# Patient Record
Sex: Male | Born: 1965 | Race: White | Hispanic: No | Marital: Married | Smoking: Current every day smoker
Health system: Southern US, Community
[De-identification: ages and names within clinical notes are randomized; demographics above are authoritative.]

## PROBLEM LIST (undated history)

## (undated) DIAGNOSIS — I10 Essential (primary) hypertension: Secondary | ICD-10-CM

## (undated) DIAGNOSIS — E785 Hyperlipidemia, unspecified: Secondary | ICD-10-CM

## (undated) DIAGNOSIS — M1990 Unspecified osteoarthritis, unspecified site: Secondary | ICD-10-CM

## (undated) DIAGNOSIS — F329 Major depressive disorder, single episode, unspecified: Secondary | ICD-10-CM

## (undated) DIAGNOSIS — N200 Calculus of kidney: Secondary | ICD-10-CM

## (undated) DIAGNOSIS — N189 Chronic kidney disease, unspecified: Secondary | ICD-10-CM

## (undated) DIAGNOSIS — F32A Depression, unspecified: Secondary | ICD-10-CM

## (undated) HISTORY — DX: Calculus of kidney: N20.0

---
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
## 2001-03-14 DIAGNOSIS — M1990 Unspecified osteoarthritis, unspecified site: Secondary | ICD-10-CM

## 2001-03-14 HISTORY — DX: Unspecified osteoarthritis, unspecified site: M19.90

## 2001-08-09 ENCOUNTER — Ambulatory Visit (HOSPITAL_COMMUNITY): Admission: RE | Admit: 2001-08-09 | Discharge: 2001-08-09 | Payer: Self-pay | Admitting: Urology

## 2001-08-09 ENCOUNTER — Encounter: Payer: Self-pay | Admitting: Urology

## 2002-05-28 ENCOUNTER — Encounter: Payer: Self-pay | Admitting: Family Medicine

## 2002-05-28 ENCOUNTER — Ambulatory Visit (HOSPITAL_COMMUNITY): Admission: RE | Admit: 2002-05-28 | Discharge: 2002-05-28 | Payer: Self-pay | Admitting: Family Medicine

## 2002-08-17 ENCOUNTER — Encounter: Payer: Self-pay | Admitting: *Deleted

## 2002-08-17 ENCOUNTER — Emergency Department (HOSPITAL_COMMUNITY): Admission: EM | Admit: 2002-08-17 | Discharge: 2002-08-17 | Payer: Self-pay

## 2011-03-15 DIAGNOSIS — I10 Essential (primary) hypertension: Secondary | ICD-10-CM

## 2011-03-15 HISTORY — DX: Essential (primary) hypertension: I10

## 2011-06-07 ENCOUNTER — Ambulatory Visit (HOSPITAL_COMMUNITY)
Admission: RE | Admit: 2011-06-07 | Discharge: 2011-06-07 | Disposition: A | Discharge status: Home / Self Care | Payer: Self-pay | Source: Ambulatory Visit | Attending: Urology | Admitting: Urology

## 2011-06-07 ENCOUNTER — Other Ambulatory Visit (HOSPITAL_COMMUNITY): Payer: Self-pay | Admitting: Urology

## 2011-06-07 DIAGNOSIS — N200 Calculus of kidney: Secondary | ICD-10-CM

## 2011-06-09 ENCOUNTER — Encounter (HOSPITAL_COMMUNITY): Payer: Self-pay

## 2011-06-13 NOTE — Patient Instructions (Addendum)
19 Patrick Rollins  06/13/2011   Your procedure is scheduled on:  06/15/2011  Report to [facility] at 1310 PM  Call this number if you have problems the morning of surgery: [phone]   Remember:   Do not eat food:After Midnight.  May have clear liquids:until Midnight .  Clear liquids include soda, tea, black coffee, apple or grape juice, broth.  Take these medicines the morning of surgery with A SIP OF WATER: lorcet,lisionopril   Do not wear jewelry, make-up or nail polish.  Do not wear lotions, powders, or perfumes. You may wear deodorant.  Do not shave 48 hours prior to surgery.  Do not bring valuables to the hospital.  Contacts, dentures or bridgework may not be worn into surgery.  Leave suitcase in the car. After surgery it may be brought to your room.  For patients admitted to the hospital, checkout time is 11:00 AM the day of discharge.   Patients discharged the day of surgery will not be allowed to drive home.  Name and phone number of your driver: family  Special Instructions: N/A   Please read over the following fact sheets that you were given: Pain Booklet, MRSA Information, Surgical Site Infection Prevention, Anesthesia Post-op Instructions and Care and Recovery After Surgery Lithotripsy for Kidney Stones WHAT ARE KIDNEY STONES? The kidneys filter blood for chemicals the body cannot use. These waste chemicals are eliminated in the urine. They are removed from the body. Under some conditions, these chemicals may become concentrated. When this happens, they form crystals in the urine. When these crystals build up and stick together, stones may form. When these stones block the flow of urine through the urinary tract, they may cause severe pain. The urinary tract is very sensitive to blockage and stretching by the stone. WHAT IS LITHOTRIPSY? Lithotripsy is a treatment that can sometimes help eliminate kidney stones and pain faster. A form of lithotripsy, also known as ESWL  (extracorporeal shock wave lithotripsy), is a nonsurgical procedure that helps your body rid itself of the kidney stone with a minimum amount of pain. EWSL is a method of crushing a kidney stone with shock waves. These shock waves pass through your body. They cause the kidney stones to crumble while still in the urinary tract. It is then easier for the smaller pieces of stone to pass in the urine. Lithotripsy usually takes about an hour. It is done in a hospital, a lithotripsy center, or a mobile unit. It usually does not require an overnight stay. Your caregiver will instruct you on preparation for the procedure. Your caregiver will tell you what to expect afterward. LET YOUR CAREGIVER KNOW ABOUT:  Allergies.   Medicines taken including herbs, eye drops, over the counter medicines (including aspirin, aleve, or motrin for treatment of inflammatory conditions) and creams.   Use of steroids (by mouth or creams).   Previous problems with anesthetics or novocaine.   Possibility of pregnancy, if this applies.   History of blood clots (thrombophlebitis).   History of bleeding or blood problems.   Previous surgery.   Other health problems.  RISKS AND COMPLICATIONS Complications of lithotripsy are uncommon, but include the following:  Infection.   Bleeding of the kidney.   Bruising of the kidney or skin.   Obstruction of the ureter (the passageway from the kidney to the bladder).   Failure of the stone to fragment (break apart).  PROCEDURE A stent (flexible tube with holes) may be placed in your ureter. The  ureter is the tube that transports the urine from the kidneys to the bladder. Your caregiver may place a stent before the procedure. This will help keep urine flowing from the kidney if the fragments of the stone block the ureter. You may receive an intravenous (IV) line to give you fluids and medicines. These medicines may help you relax or make you sleep. During the procedure, you will  lie comfortably on a fluid-filled cushion or in a warm-water bath. After an x-ray or ultrasound locates your stone, shock waves are aimed at the stone. If you are awake, you may feel a tapping sensation (feeling) as the shock waves pass through your body. If large stone particles remain after treatment, a second procedure may be necessary at a later date. For comfort during the test:  Relax as much as possible.   Try to remain still as much as possible.   Try to follow instructions to speed up the test.   Let your caregiver know if you are uncomfortable, anxious, or in pain.  AFTER THE PROCEDURE  After surgery, you will be taken to the recovery area. A nurse will watch and check your progress. Once you're awake, stable, and taking fluids well, you will be allowed to go home as long as there are no problems. You may be prescribed antibiotics (medicines that kill germs) to help prevent infection. You may also be prescribed pain medicine if needed. In a week or two, your doctor may remove your stent, if you have one. Your caregiver will check to see whether or not stone particles remain. PASSING THE STONE It may take anywhere from a day to several weeks for the stone particles to leave your body. During this time, drink at least 8 to 12 eight ounce glasses of water every day. It is normal for your urine to be cloudy or slightly bloody for a few weeks following this procedure. You may even see small pieces of stone in your urine. A slight fever and some pain are also normal. Your caregiver may ask you to strain your urine to collect some stone particles for chemical analysis. If you find particles while straining the urine, save them. Analysis tells you and the caregiver what the stone is made of. Knowing this may help prevent future stones. PREVENTING FUTURE STONES  Drink about 8 to 12, eight-ounce glasses of water every day.   Follow the diet your caregiver recommends.   Take your prescribed  medicine.   See your caregiver regularly for checkups.  SEEK IMMEDIATE MEDICAL CARE IF:  You develop an oral temperature above 102 F (38.9 C), or as your caregiver suggests.   Your pain is not relieved by medicine.   You develop nausea (feeling sick to your stomach) and vomiting.   You develop heavy bleeding.   You have difficulty urinating.  Document Released: 02/26/2000 Document Revised: 02/17/2011 Document Reviewed: 12/21/2007 [facility] Patient Information 2012 [place], [place].PATIENT INSTRUCTIONS POST-ANESTHESIA  IMMEDIATELY FOLLOWING SURGERY:  Do not drive or operate machinery for the first twenty four hours after surgery.  Do not make any important decisions for twenty four hours after surgery or while taking narcotic pain medications or sedatives.  If you develop intractable nausea and vomiting or a severe headache please notify your doctor immediately.  FOLLOW-UP:  Please make an appointment with your surgeon as instructed. You do not need to follow up with anesthesia unless specifically instructed to do so.  WOUND CARE INSTRUCTIONS (if applicable):  Keep a dry clean  dressing on the anesthesia/puncture wound site if there is drainage.  Once the wound has quit draining you may leave it open to air.  Generally you should leave the bandage intact for twenty four hours unless there is drainage.  If the epidural site drains for more than 36-48 hours please call the anesthesia department.  QUESTIONS?:  Please feel free to call your physician or the hospital operator if you have any questions, and they will be happy to assist you.     [facility] Anesthesia Department [address] [place] [phone]

## 2011-06-14 ENCOUNTER — Encounter (HOSPITAL_COMMUNITY): Payer: Self-pay

## 2011-06-14 ENCOUNTER — Other Ambulatory Visit: Payer: Self-pay

## 2011-06-14 ENCOUNTER — Encounter (HOSPITAL_COMMUNITY)
Admission: RE | Admit: 2011-06-14 | Discharge: 2011-06-14 | Disposition: A | Discharge status: Home / Self Care | Payer: Medicaid Other | Source: Ambulatory Visit | Attending: Urology | Admitting: Urology

## 2011-06-14 HISTORY — DX: Chronic kidney disease, unspecified: N18.9

## 2011-06-14 HISTORY — DX: Unspecified osteoarthritis, unspecified site: M19.90

## 2011-06-14 HISTORY — DX: Essential (primary) hypertension: I10

## 2011-06-14 LAB — BASIC METABOLIC PANEL
BUN: 9 mg/dL (ref 6–23)
CO2: 26 mEq/L (ref 19–32)
Calcium: 9.7 mg/dL (ref 8.4–10.5)
Chloride: 104 mEq/L (ref 96–112)
Creatinine, Ser: 0.87 mg/dL (ref 0.50–1.35)
Glucose, Bld: 82 mg/dL (ref 70–99)

## 2011-06-15 ENCOUNTER — Ambulatory Visit (HOSPITAL_COMMUNITY): Payer: Medicaid Other

## 2011-06-15 ENCOUNTER — Ambulatory Visit (HOSPITAL_COMMUNITY)
Admission: RE | Admit: 2011-06-15 | Discharge: 2011-06-15 | Disposition: A | Discharge status: Home / Self Care | Payer: Medicaid Other | Source: Ambulatory Visit | Attending: Urology | Admitting: Urology

## 2011-06-15 ENCOUNTER — Encounter (HOSPITAL_COMMUNITY)
Admission: RE | Disposition: A | Discharge status: Home / Self Care | Payer: Self-pay | Source: Ambulatory Visit | Attending: Urology

## 2011-06-15 ENCOUNTER — Encounter (HOSPITAL_COMMUNITY): Payer: Self-pay | Admitting: *Deleted

## 2011-06-15 DIAGNOSIS — Z79899 Other long term (current) drug therapy: Secondary | ICD-10-CM | POA: Insufficient documentation

## 2011-06-15 DIAGNOSIS — F17200 Nicotine dependence, unspecified, uncomplicated: Secondary | ICD-10-CM | POA: Insufficient documentation

## 2011-06-15 DIAGNOSIS — Z01812 Encounter for preprocedural laboratory examination: Secondary | ICD-10-CM | POA: Insufficient documentation

## 2011-06-15 DIAGNOSIS — Z01810 Encounter for preprocedural cardiovascular examination: Secondary | ICD-10-CM | POA: Insufficient documentation

## 2011-06-15 DIAGNOSIS — I10 Essential (primary) hypertension: Secondary | ICD-10-CM | POA: Insufficient documentation

## 2011-06-15 DIAGNOSIS — N200 Calculus of kidney: Secondary | ICD-10-CM | POA: Insufficient documentation

## 2011-06-15 HISTORY — PX: EXTRACORPOREAL SHOCK WAVE LITHOTRIPSY: SHX1557

## 2011-06-15 SURGERY — LITHOTRIPSY, ESWL
Anesthesia: Moderate Sedation | Laterality: Right

## 2011-06-15 MED ORDER — DIAZEPAM 5 MG PO TABS
ORAL_TABLET | ORAL | Status: AC
  Administered 2011-06-15: 5 mg via ORAL
  Filled 2011-06-15: qty 2

## 2011-06-15 MED ORDER — SODIUM CHLORIDE 0.45 % IV SOLN
INTRAVENOUS | Status: DC
  Administered 2011-06-15: 1000 mL via INTRAVENOUS

## 2011-06-15 MED ORDER — DIPHENHYDRAMINE HCL 25 MG PO CAPS
ORAL_CAPSULE | ORAL | Status: AC
  Administered 2011-06-15: 25 mg via ORAL
  Filled 2011-06-15: qty 1

## 2011-06-15 MED ORDER — DIAZEPAM 5 MG PO TABS
10.0000 mg | ORAL_TABLET | Freq: Once | ORAL | Status: AC
  Administered 2011-06-15: 5 mg via ORAL

## 2011-06-15 MED ORDER — DIAZEPAM 5 MG PO TABS
5.0000 mg | ORAL_TABLET | Freq: Once | ORAL | Status: AC
  Administered 2011-06-15: 5 mg via ORAL

## 2011-06-15 MED ORDER — DIPHENHYDRAMINE HCL 25 MG PO CAPS
25.0000 mg | ORAL_CAPSULE | Freq: Once | ORAL | Status: AC
  Administered 2011-06-15: 25 mg via ORAL

## 2011-06-15 SURGICAL SUPPLY — 3 items
CLOTH BEACON ORANGE TIMEOUT ST (SAFETY) IMPLANT
GOWN STRL REIN XL XLG (GOWN DISPOSABLE) IMPLANT
TOWEL OR 17X26 4PK STRL BLUE (TOWEL DISPOSABLE) IMPLANT
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
## 2011-06-15 NOTE — Progress Notes (Signed)
No change in H&P on reexamination. 

## 2011-06-15 NOTE — Discharge Instructions (Signed)
Anesthesia Post Note  Patient: Patrick Rollins  Procedure(s) Performed: Procedure(s) (LRB): EXTRACORPOREAL SHOCK WAVE LITHOTRIPSY (ESWL) (Right)  Anesthesia type: conscious sedation  Patient location: Short Stay  Post pain: Pain level controlled  Post assessment: Post-op Vital signs reviewed, Adequate PO intake, Pain level controlled and No headache  Last Vitals:  Filed Vitals:   06/15/11 1635  BP: 150/80  Pulse: 62  Temp: 97.7 F (36.5 C)  Resp: 20    Post vital signs: stable  Level of consciousness: awake, alert  and oriented  Complications: No apparent anesthesia complicationsLithotripsy Care After Refer to this sheet for the next few weeks. These discharge instructions provide you with general information on caring for yourself after you leave the hospital. Your caregiver may also give you specific instructions. Your treatment has been planned according to the most current medical practices available, but unavoidable complications sometimes occur. If you have any problems or questions after discharge, please call your caregiver. AFTER THE PROCEDURE   The recovery time will vary with the procedure done.   You will be taken to the recovery area. A nurse will watch and check your progress. Once you are awake, stable, and taking fluids well, you will be allowed to go home as long as there are no problems.   Your urine may have a red tinge for a few days after treatment. Blood loss is usually minimal.   You may have soreness in the back or flank area. This usually goes away after a few days. The procedure can cause blotches or bruises on the back where the pressure wave enters the skin. These marks usually cause only minimal discomfort and should disappear in a short time.   Stone fragments should begin to pass within 24 hours of treatment. However, a delayed passage is not unusual.   You may have pain, discomfort, and feel sick to your stomach (nauseous) when the crushed  (pulverized) fragments of stone are passed down the tube from the kidney to the bladder. Stone fragments can pass soon after the procedure and may last for up to 4 to 8 weeks.   A small number of patients may have severe pain when stone fragments are not able to pass, which leads to an obstruction.   If your stone is greater than 1 inch/2.5 centimeters in diameter or if you have multiple stones that have a combined diameter greater than 1 inch/2.5 centimeters, you may require more than 1 treatment.   You must have someone drive you home.  HOME CARE INSTRUCTIONS   Rest at home until you feel your energy improving.   Only take over-the-counter or prescription medicines for pain, discomfort, or fever as directed by your caregiver. Depending on the type of lithotripsy, you may need to take medicines (antibiotics) that kill germs and anti-inflammatory medicines for a few days.   Drink enough water and fluids to keep your urine clear or pale yellow. This helps "flush" your kidneys. It helps pass any remaining pieces of stone and prevents stones from coming back.   Most people can resume daily activities within 1 or 2 days after standard lithotripsy. It can take longer to recover from laser and percutaneous lithotripsy.   If the stones are in your urinary system, you may be asked to strain your urine at home to look for stones. Any stones that are found can be sent to a medical lab for examination.   Visit your caregiver for a follow-up appointment in a few weeks. Your  doctor may remove your stent if you have one. Your caregiver will also check to see whether stone particles still remain.  SEEK MEDICAL CARE IF:   You have an oral temperature above 102 F (38.9 C).   Your pain is not relieved by medicine.   You have a lasting nauseous feeling.   You feel there is too much blood in the urine.   You develop persistent problems with frequent and/or painful urination that does not at least  partially improve after 2 days following the procedure.   You have a congested cough.   You feel lightheaded.   You develop a rash or any other signs that might suggest an allergic problem.   You develop any reaction or side effects to your medicine(s).  SEEK IMMEDIATE MEDICAL CARE IF:   You experience severe back and/or flank pain.   You see nothing but blood when you urinate.   You cannot pass any urine at all.   You have an oral temperature above 102 F (38.9 C), not controlled by medicine.   You develop shortness of breath, difficulty breathing, or chest pain.   You develop vomiting that will not stop after 6 to 8 hours.   You have a fainting episode.  MAKE SURE YOU:   Understand these instructions.   Will watch your condition.   Will get help right away if you are not doing well or get worse.  Document Released: 03/20/2007 Document Revised: 02/17/2011 Document Reviewed: 03/20/2007 [facility] Patient Information 2012 [place], [place].

## 2011-06-17 ENCOUNTER — Encounter (HOSPITAL_COMMUNITY): Payer: Self-pay | Admitting: Urology

## 2011-06-22 ENCOUNTER — Other Ambulatory Visit (HOSPITAL_COMMUNITY): Payer: Self-pay | Admitting: Urology

## 2011-06-22 ENCOUNTER — Ambulatory Visit (HOSPITAL_COMMUNITY)
Admission: RE | Admit: 2011-06-22 | Discharge: 2011-06-22 | Disposition: A | Discharge status: Home / Self Care | Payer: Medicaid Other | Source: Ambulatory Visit | Attending: Urology | Admitting: Urology

## 2011-06-22 DIAGNOSIS — N200 Calculus of kidney: Secondary | ICD-10-CM

## 2011-07-21 ENCOUNTER — Other Ambulatory Visit (HOSPITAL_COMMUNITY): Payer: Self-pay | Admitting: Urology

## 2011-07-21 ENCOUNTER — Ambulatory Visit (HOSPITAL_COMMUNITY)
Admission: RE | Admit: 2011-07-21 | Discharge: 2011-07-21 | Disposition: A | Discharge status: Home / Self Care | Payer: Medicaid Other | Source: Ambulatory Visit | Attending: Urology | Admitting: Urology

## 2011-07-21 DIAGNOSIS — N200 Calculus of kidney: Secondary | ICD-10-CM

## 2011-07-21 DIAGNOSIS — N201 Calculus of ureter: Secondary | ICD-10-CM | POA: Insufficient documentation

## 2013-09-20 IMAGING — CR DG ABDOMEN 1V
1 series · 1 of 1 positions shown · non-contrast
Comparison: 06/15/2010.  No recent CT.  Most recent CT of
08/09/2001 reviewed.

CLINICAL DATA: History of right-sided renal calculi.  Lithotripsy
last week.  Recent stone passage.

ABDOMEN - 1 VIEW

[view not recorded]
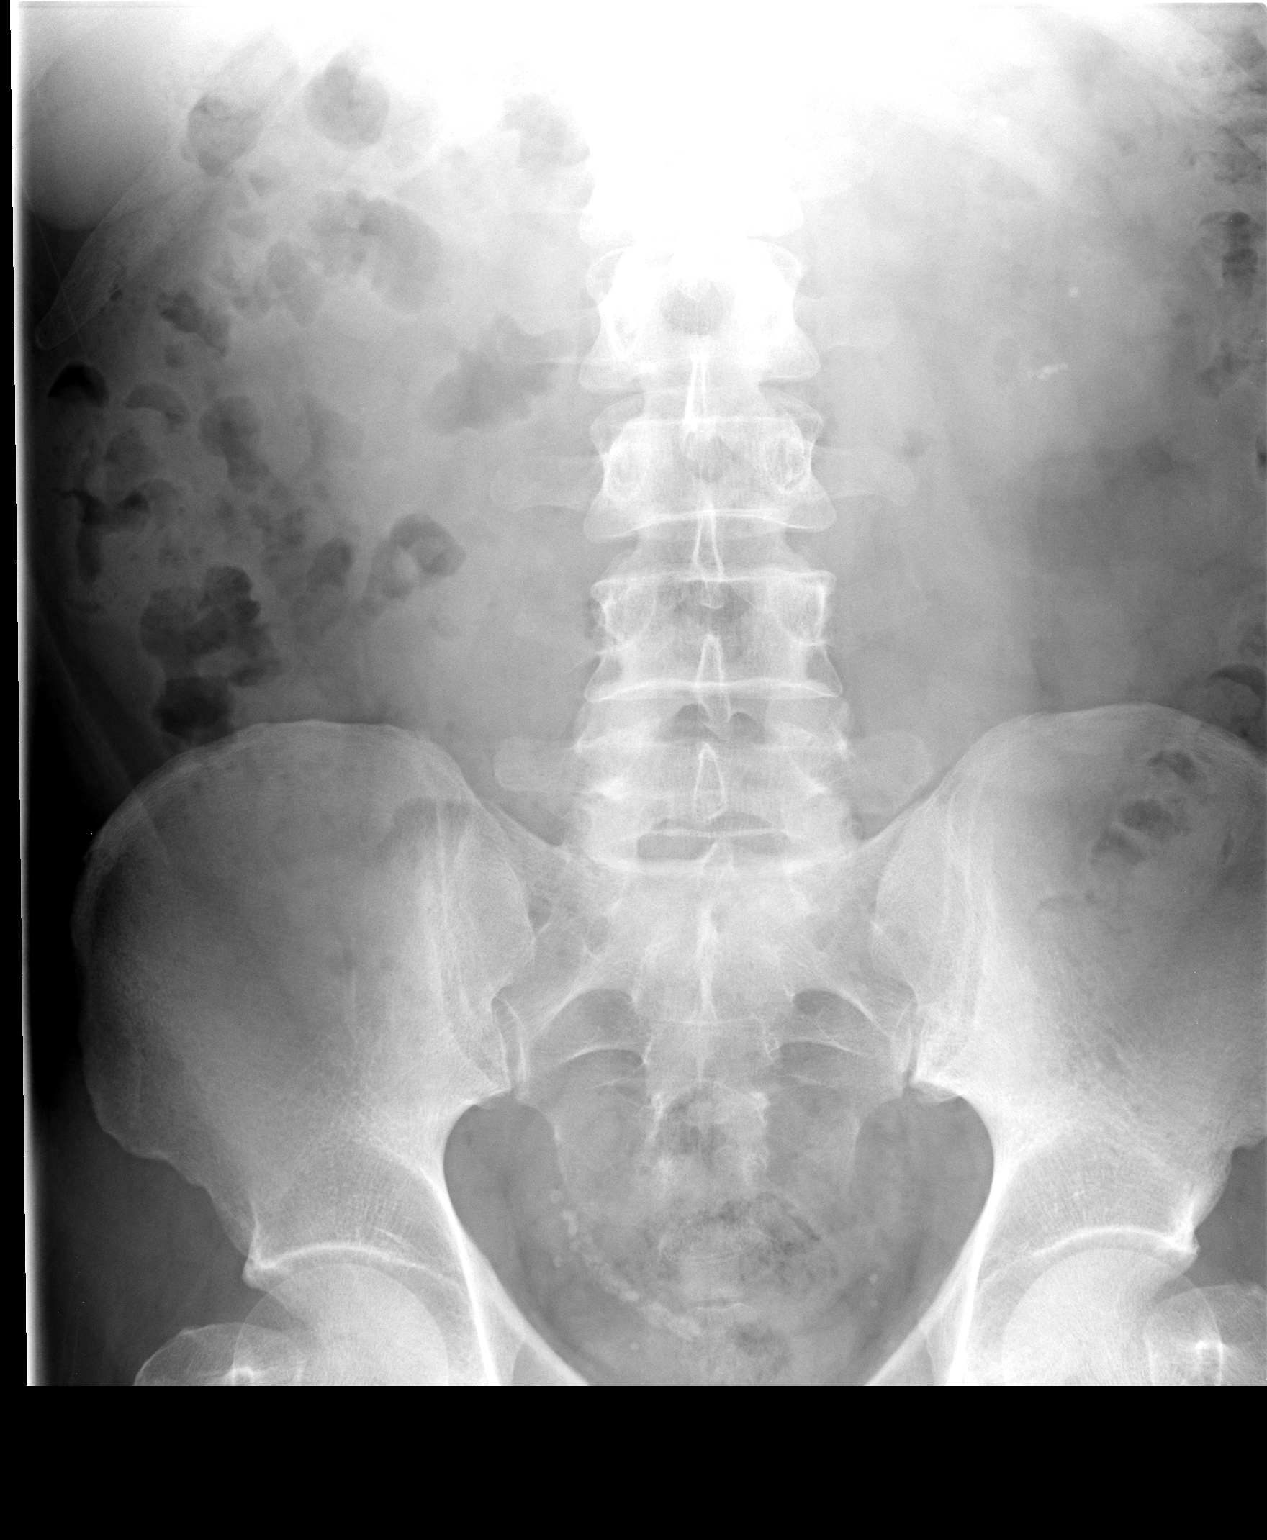

[1 of 1 positions shown; findings below may reference images not displayed]

FINDINGS: Small calculi identified about the inter lower pole left
kidney.  Largest measures 7 mm; projects over the lower pole.

The dominant stone or stones within the right kidney are no longer
identified.  There may be tiny residual collecting systems stones.

Vascular calcifications identified in the pelvis.  Foci of
increased density along the expected course of the right ureter are
new and suspicious for numerous small stones.
IMPRESSION: 1.  Interval passage of dominant right-sided renal stone or stones.
Suspect multiple small stone fragments within the distal right
ureter.
2.  Small volume left sided renal collecting system calculi.

## 2015-08-26 DIAGNOSIS — Z23 Encounter for immunization: Secondary | ICD-10-CM | POA: Diagnosis not present

## 2015-08-26 DIAGNOSIS — Z1389 Encounter for screening for other disorder: Secondary | ICD-10-CM | POA: Diagnosis not present

## 2015-08-26 DIAGNOSIS — Z6834 Body mass index (BMI) 34.0-34.9, adult: Secondary | ICD-10-CM | POA: Diagnosis not present

## 2015-08-26 DIAGNOSIS — G894 Chronic pain syndrome: Secondary | ICD-10-CM | POA: Diagnosis not present

## 2015-11-23 DIAGNOSIS — Z79891 Long term (current) use of opiate analgesic: Secondary | ICD-10-CM | POA: Diagnosis not present

## 2015-11-23 DIAGNOSIS — Z1389 Encounter for screening for other disorder: Secondary | ICD-10-CM | POA: Diagnosis not present

## 2015-11-23 DIAGNOSIS — G894 Chronic pain syndrome: Secondary | ICD-10-CM | POA: Diagnosis not present

## 2015-11-23 DIAGNOSIS — Z6834 Body mass index (BMI) 34.0-34.9, adult: Secondary | ICD-10-CM | POA: Diagnosis not present

## 2015-11-23 DIAGNOSIS — N200 Calculus of kidney: Secondary | ICD-10-CM | POA: Diagnosis not present

## 2015-11-23 DIAGNOSIS — I10 Essential (primary) hypertension: Secondary | ICD-10-CM | POA: Diagnosis not present

## 2015-12-29 DIAGNOSIS — Z1389 Encounter for screening for other disorder: Secondary | ICD-10-CM | POA: Diagnosis not present

## 2015-12-29 DIAGNOSIS — Z23 Encounter for immunization: Secondary | ICD-10-CM | POA: Diagnosis not present

## 2015-12-29 DIAGNOSIS — M545 Low back pain: Secondary | ICD-10-CM | POA: Diagnosis not present

## 2015-12-29 DIAGNOSIS — I10 Essential (primary) hypertension: Secondary | ICD-10-CM | POA: Diagnosis not present

## 2015-12-29 DIAGNOSIS — G894 Chronic pain syndrome: Secondary | ICD-10-CM | POA: Diagnosis not present

## 2015-12-29 DIAGNOSIS — Z6834 Body mass index (BMI) 34.0-34.9, adult: Secondary | ICD-10-CM | POA: Diagnosis not present

## 2016-01-25 DIAGNOSIS — G894 Chronic pain syndrome: Secondary | ICD-10-CM | POA: Diagnosis not present

## 2016-01-25 DIAGNOSIS — Z1389 Encounter for screening for other disorder: Secondary | ICD-10-CM | POA: Diagnosis not present

## 2016-01-25 DIAGNOSIS — I10 Essential (primary) hypertension: Secondary | ICD-10-CM | POA: Diagnosis not present

## 2016-01-25 DIAGNOSIS — M1991 Primary osteoarthritis, unspecified site: Secondary | ICD-10-CM | POA: Diagnosis not present

## 2016-01-25 DIAGNOSIS — Z6835 Body mass index (BMI) 35.0-35.9, adult: Secondary | ICD-10-CM | POA: Diagnosis not present

## 2016-02-23 DIAGNOSIS — Z1389 Encounter for screening for other disorder: Secondary | ICD-10-CM | POA: Diagnosis not present

## 2016-02-23 DIAGNOSIS — Z6835 Body mass index (BMI) 35.0-35.9, adult: Secondary | ICD-10-CM | POA: Diagnosis not present

## 2016-02-23 DIAGNOSIS — N200 Calculus of kidney: Secondary | ICD-10-CM | POA: Diagnosis not present

## 2016-02-23 DIAGNOSIS — I10 Essential (primary) hypertension: Secondary | ICD-10-CM | POA: Diagnosis not present

## 2016-02-23 DIAGNOSIS — Z0001 Encounter for general adult medical examination with abnormal findings: Secondary | ICD-10-CM | POA: Diagnosis not present

## 2016-02-23 DIAGNOSIS — Z23 Encounter for immunization: Secondary | ICD-10-CM | POA: Diagnosis not present

## 2016-02-23 DIAGNOSIS — G894 Chronic pain syndrome: Secondary | ICD-10-CM | POA: Diagnosis not present

## 2016-02-24 DIAGNOSIS — Z1389 Encounter for screening for other disorder: Secondary | ICD-10-CM | POA: Diagnosis not present

## 2016-02-24 DIAGNOSIS — Z23 Encounter for immunization: Secondary | ICD-10-CM | POA: Diagnosis not present

## 2016-02-24 DIAGNOSIS — Z6835 Body mass index (BMI) 35.0-35.9, adult: Secondary | ICD-10-CM | POA: Diagnosis not present

## 2016-02-24 DIAGNOSIS — E6609 Other obesity due to excess calories: Secondary | ICD-10-CM | POA: Diagnosis not present

## 2016-02-24 DIAGNOSIS — E748 Other specified disorders of carbohydrate metabolism: Secondary | ICD-10-CM | POA: Diagnosis not present

## 2016-05-23 DIAGNOSIS — Z1389 Encounter for screening for other disorder: Secondary | ICD-10-CM | POA: Diagnosis not present

## 2016-05-23 DIAGNOSIS — E669 Obesity, unspecified: Secondary | ICD-10-CM | POA: Diagnosis not present

## 2016-05-23 DIAGNOSIS — G894 Chronic pain syndrome: Secondary | ICD-10-CM | POA: Diagnosis not present

## 2016-05-23 DIAGNOSIS — E782 Mixed hyperlipidemia: Secondary | ICD-10-CM | POA: Diagnosis not present

## 2016-05-23 DIAGNOSIS — Z681 Body mass index (BMI) 19 or less, adult: Secondary | ICD-10-CM | POA: Diagnosis not present

## 2016-08-18 DIAGNOSIS — I10 Essential (primary) hypertension: Secondary | ICD-10-CM | POA: Diagnosis not present

## 2016-08-18 DIAGNOSIS — L918 Other hypertrophic disorders of the skin: Secondary | ICD-10-CM | POA: Diagnosis not present

## 2016-08-18 DIAGNOSIS — Z1389 Encounter for screening for other disorder: Secondary | ICD-10-CM | POA: Diagnosis not present

## 2016-08-18 DIAGNOSIS — G894 Chronic pain syndrome: Secondary | ICD-10-CM | POA: Diagnosis not present

## 2016-08-18 DIAGNOSIS — Z6834 Body mass index (BMI) 34.0-34.9, adult: Secondary | ICD-10-CM | POA: Diagnosis not present

## 2016-11-01 ENCOUNTER — Ambulatory Visit (INDEPENDENT_AMBULATORY_CARE_PROVIDER_SITE_OTHER): Payer: BLUE CROSS/BLUE SHIELD | Admitting: Gastroenterology

## 2016-11-01 ENCOUNTER — Other Ambulatory Visit: Payer: Self-pay

## 2016-11-01 ENCOUNTER — Telehealth: Payer: Self-pay

## 2016-11-01 ENCOUNTER — Encounter: Payer: Self-pay | Admitting: Gastroenterology

## 2016-11-01 DIAGNOSIS — Z1211 Encounter for screening for malignant neoplasm of colon: Secondary | ICD-10-CM | POA: Diagnosis not present

## 2016-11-01 DIAGNOSIS — Z1212 Encounter for screening for malignant neoplasm of rectum: Principal | ICD-10-CM

## 2016-11-01 DIAGNOSIS — Z79899 Other long term (current) drug therapy: Secondary | ICD-10-CM | POA: Diagnosis not present

## 2016-11-01 MED ORDER — PEG 3350-KCL-NA BICARB-NACL 420 G PO SOLR: 4000.0000 mL | ORAL | 0 refills | Status: DC

## 2016-11-01 NOTE — Patient Instructions (Signed)
1. Colonoscopy as scheduled. See separate instructions.  

## 2016-11-01 NOTE — Assessment & Plan Note (Signed)
51 year old gentleman presenting to schedule his first ever colonoscopy. Chronically on narcotics. Plan for deep sedation in the OR.  I have discussed the risks, alternatives, benefits with regards to but not limited to the risk of reaction to medication, bleeding, infection, perforation and the patient is agreeable to proceed. Written consent to be obtained.

## 2016-11-01 NOTE — Progress Notes (Addendum)
REVIEWED-NO ADDITIONAL RECOMMENDATIONS.Marland Kitchen  Primary Care Physician:  Redmond School, MD  Primary Gastroenterologist:  Barney Drain, MD   Chief Complaint  Patient presents with  . Colonoscopy    HPI:  Patrick Rollins is a 51 y.o. male here at the request of Dr. Gerarda Fraction for screening colonoscopy. He has no FH of CRC. BM 4-5 times per day depending on what he eats. No Melena or rectal bleeding. Denies abdominal pain. He believes he has a hemorrhoid aggravated by heavy lifting. No frequent heartburn, dysphagia, vomiting, unintentional weight loss.  Chronically on oxycodone, denies constipation.  Current Outpatient Prescriptions  Medication Sig Dispense Refill  . aspirin EC 81 MG tablet Take 81 mg by mouth daily.    Marland Kitchen atorvastatin (LIPITOR) 20 MG tablet Take 20 mg by mouth daily.    Marland Kitchen gemfibrozil (LOPID) 600 MG tablet Take 600 mg by mouth 2 (two) times daily before a meal.    . lisinopril (PRINIVIL,ZESTRIL) 10 MG tablet Take 10 mg by mouth daily.    . naproxen (NAPROSYN) 500 MG tablet Take 500 mg by mouth 2 (two) times daily with a meal.    . oxyCODONE-acetaminophen (PERCOCET) 7.5-325 MG per tablet Take 1 tablet by mouth every 6 (six) hours as needed.     No current facility-administered medications for this visit.     Allergies as of 11/01/2016 - Review Complete 11/01/2016  Allergen Reaction Noted  . Penicillins Hives 06/09/2011    Past Medical History:  Diagnosis Date  . 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 Chronic kidney disease    Kidney stones  . Hypertension 2013  . Kidney stones     Past Surgical History:  Procedure Laterality Date  . car [place]  . EXTRACORPOREAL SHOCK WAVE LITHOTRIPSY  06/15/2011   Procedure: EXTRACORPOREAL SHOCK WAVE LITHOTRIPSY (ESWL);  Surgeon: Marissa Nestle, MD;  Location: [facility];  Service: Urology;  Laterality: Right;  ESWL Right Renal Calculus    Family History  Problem Relation Age of Onset  . Cancer Father        ?lung  . Muscular dystrophy  Son        died age 28  . Breast cancer Paternal Aunt   . Breast cancer Other   . Colon cancer Neg Hx     Social History   Social History  . Marital status: Married    Spouse name: N/A  . Number of children: N/A  . Years of education: N/A   Occupational History  . Not on file.   Social History Main Topics  . Smoking status: Current Every Day Smoker    Packs/day: 1.50    Years: 30.00    Types: Cigarettes  . Smokeless tobacco: Never Used  . Alcohol use Yes     Comment: few drinks on saturdays  . Drug use: No  . Sexual activity: Not on file   Other Topics Concern  . Not on file   Social History Narrative  . No narrative on file      ROS:  General: Negative for anorexia, weight loss, fever, chills, fatigue, weakness. Eyes: Negative for vision changes.  ENT: Negative for hoarseness, difficulty swallowing , nasal congestion. CV: Negative for chest pain, angina, palpitations, dyspnea on exertion, peripheral edema.  Respiratory: Negative for dyspnea at rest, dyspnea on exertion, cough, sputum, wheezing.  GI: See history of present illness. GU:  Negative for dysuria, hematuria, urinary incontinence, urinary frequency, nocturnal urination.  MS: Negative for joint pain, low  back pain.  Derm: Negative for rash or itching.  Neuro: Negative for weakness, abnormal sensation, seizure, frequent headaches, memory loss, confusion.  Psych: Negative for anxiety, depression, suicidal ideation, hallucinations.  Endo: Negative for unusual weight change.  Heme: Negative for bruising or bleeding. Allergy: Negative for rash or hives.    Physical Examination:  BP (!) 148/88   Pulse 93   Temp (!) 97 F (36.1 C) (Oral)   Ht 5\' 6"  (1.676 m)   Wt 214 lb 12.8 oz (97.4 kg)   BMI 34.67 kg/m    General: Well-nourished, well-developed in no acute distress.  Head: Normocephalic, atraumatic.   Eyes: Conjunctiva pink, no icterus. Mouth: Oropharyngeal mucosa moist and pink , no lesions  erythema or exudate. Neck: Supple without thyromegaly, masses, or lymphadenopathy.  Lungs: Clear to auscultation bilaterally.  Heart: Regular rate and rhythm, no murmurs rubs or gallops.  Abdomen: Bowel sounds are normal, nontender, nondistended, no hepatosplenomegaly or masses, no abdominal bruits, no rebound or guarding.  Small umb hernia easily reducible and nontender Rectal: Deferred Extremities: No lower extremity edema. No clubbing or deformities.  Neuro: Alert and oriented x 4 , grossly normal neurologically.  Skin: Warm and dry, no rash or jaundice.   Psych: Alert and cooperative, normal mood and affect.

## 2016-11-01 NOTE — Progress Notes (Signed)
CC'D TO PCP °

## 2016-11-01 NOTE — Telephone Encounter (Signed)
Called and informed pt of pre-op appt 11/16/16 at 9:00am. Letter also mailed.

## 2016-11-09 NOTE — Patient Instructions (Signed)
BARNES FLOREK  11/09/2016     @PREFPERIOPPHARMACY @   Your procedure is scheduled on  11/22/2016   Report to [facility] at  845  A.M.  Call this number if you have problems the morning of surgery:  [phone]   Remember:  Do not eat food or drink liquids after midnight.  Take these medicines the morning of surgery with A SIP OF WATER  Lisinopril, oxycodone.   Do not wear jewelry, make-up or nail polish.  Do not wear lotions, powders, or perfumes, or deoderant.  Do not shave 48 hours prior to surgery.  Men may shave face and neck.  Do not bring valuables to the hospital.  [facility] is not responsible for any belongings or valuables.  Contacts, dentures or bridgework may not be worn into surgery.  Leave your suitcase in the car.  After surgery it may be brought to your room.  For patients admitted to the hospital, discharge time will be determined by your treatment team.  Patients discharged the day of surgery will not be allowed to drive home.   Name and phone number of your driver:   family Special instructions:  Follow the diet and prep instructions given to you by Dr Nona Dell office.  Please read over the following fact sheets that you were given. Anesthesia Post-op Instructions and Care and Recovery After Surgery       Colonoscopy, Adult A colonoscopy is an exam to look at the entire large intestine. During the exam, a lubricated, bendable tube is inserted into the anus and then passed into the rectum, colon, and other parts of the large intestine. A colonoscopy is often done as a part of normal colorectal screening or in response to certain symptoms, such as anemia, persistent diarrhea, abdominal pain, and blood in the stool. The exam can help screen for and diagnose medical problems, including:  Tumors.  Polyps.  Inflammation.  Areas of bleeding.  Tell a health care provider about:  Any allergies you have.  All medicines you are taking,  including vitamins, herbs, eye drops, creams, and over-the-counter medicines.  Any problems you or family members have had with anesthetic medicines.  Any blood disorders you have.  Any surgeries you have had.  Any medical conditions you have.  Any problems you have had passing stool. What are the risks? Generally, this is a safe procedure. However, problems may occur, including:  Bleeding.  A tear in the intestine.  A reaction to medicines given during the exam.  Infection (rare).  What happens before the procedure? Eating and drinking restrictions Follow instructions from your health care provider about eating and drinking, which may include:  A few days before the procedure - follow a low-fiber diet. Avoid nuts, seeds, dried fruit, raw fruits, and vegetables.  1-3 days before the procedure - follow a clear liquid diet. Drink only clear liquids, such as clear broth or bouillon, black coffee or tea, clear juice, clear soft drinks or sports drinks, gelatin dessert, and popsicles. Avoid any liquids that contain red or purple dye.  On the day of the procedure - do not eat or drink anything during the 2 hours before the procedure, or within the time period that your health care provider recommends.  Bowel prep If you were prescribed an oral bowel prep to clean out your colon:  Take it as told by your health care provider. Starting the day before your procedure, you  will need to drink a large amount of medicated liquid. The liquid will cause you to have multiple loose stools until your stool is almost clear or light green.  If your skin or anus gets irritated from diarrhea, you may use these to relieve the irritation: ? Medicated wipes, such as adult wet wipes with aloe and vitamin E. ? A skin soothing-product like petroleum jelly.  If you vomit while drinking the bowel prep, take a break for up to 60 minutes and then begin the bowel prep again. If vomiting continues and you  cannot take the bowel prep without vomiting, call your health care provider.  General instructions  Ask your health care provider about changing or stopping your regular medicines. This is especially important if you are taking diabetes medicines or blood thinners.  Plan to have someone take you home from the hospital or clinic. What happens during the procedure?  An IV tube may be inserted into one of your veins.  You will be given medicine to help you relax (sedative).  To reduce your risk of infection: ? Your health care team will wash or sanitize their hands. ? Your anal area will be washed with soap.  You will be asked to lie on your side with your knees bent.  Your health care provider will lubricate a long, thin, flexible tube. The tube will have a camera and a light on the end.  The tube will be inserted into your anus.  The tube will be gently eased through your rectum and colon.  Air will be delivered into your colon to keep it open. You may feel some pressure or cramping.  The camera will be used to take images during the procedure.  A small tissue sample may be removed from your body to be examined under a microscope (biopsy). If any potential problems are found, the tissue will be sent to a lab for testing.  If small polyps are found, your health care provider may remove them and have them checked for cancer cells.  The tube that was inserted into your anus will be slowly removed. The procedure may vary among health care providers and hospitals. What happens after the procedure?  Your blood pressure, heart rate, breathing rate, and blood oxygen level will be monitored until the medicines you were given have worn off.  Do not drive for 24 hours after the exam.  You may have a small amount of blood in your stool.  You may pass gas and have mild abdominal cramping or bloating due to the air that was used to inflate your colon during the exam.  It is up to you to  get the results of your procedure. Ask your health care provider, or the department performing the procedure, when your results will be ready. This information is not intended to replace advice given to you by your health care provider. Make sure you discuss any questions you have with your health care provider. Document Released: 02/26/2000 Document Revised: 12/30/2015 Document Reviewed: 05/12/2015 Elsevier Interactive Patient Education  2018 [employer].  Colonoscopy, Adult, Care After This sheet gives you information about how to care for yourself after your procedure. Your health care provider may also give you more specific instructions. If you have problems or questions, contact your health care provider. What can I expect after the procedure? After the procedure, it is common to have:  A small amount of blood in your stool for 24 hours after the procedure.  Some  gas.  Mild abdominal cramping or bloating.  Follow these instructions at home: General instructions   For the first 24 hours after the procedure: ? Do not drive or use machinery. ? Do not sign important documents. ? Do not drink alcohol. ? Do your regular daily activities at a slower pace than normal. ? Eat soft, easy-to-digest foods. ? Rest often.  Take over-the-counter or prescription medicines only as told by your health care provider.  It is up to you to get the results of your procedure. Ask your health care provider, or the department performing the procedure, when your results will be ready. Relieving cramping and bloating  Try walking around when you have cramps or feel bloated.  Apply heat to your abdomen as told by your health care provider. Use a heat source that your health care provider recommends, such as a moist heat pack or a heating pad. ? Place a towel between your skin and the heat source. ? Leave the heat on for 20-30 minutes. ? Remove the heat if your skin turns bright red. This is  especially important if you are unable to feel pain, heat, or cold. You may have a greater risk of getting burned. Eating and drinking  Drink enough fluid to keep your urine clear or pale yellow.  Resume your normal diet as instructed by your health care provider. Avoid heavy or fried foods that are hard to digest.  Avoid drinking alcohol for as long as instructed by your health care provider. Contact a health care provider if:  You have blood in your stool 2-3 days after the procedure. Get help right away if:  You have more than a small spotting of blood in your stool.  You pass large blood clots in your stool.  Your abdomen is swollen.  You have nausea or vomiting.  You have a fever.  You have increasing abdominal pain that is not relieved with medicine. This information is not intended to replace advice given to you by your health care provider. Make sure you discuss any questions you have with your health care provider. Document Released: 10/13/2003 Document Revised: 11/23/2015 Document Reviewed: 05/12/2015 Elsevier Interactive Patient Education  2018 [place] Anesthesia is a term that refers to techniques, procedures, and medicines that help a person stay safe and comfortable during a medical procedure. Monitored anesthesia care, or sedation, is one type of anesthesia. Your anesthesia specialist may recommend sedation if you will be having a procedure that does not require you to be unconscious, such as:  Cataract surgery.  A dental procedure.  A biopsy.  A colonoscopy.  During the procedure, you may receive a medicine to help you relax (sedative). There are three levels of sedation:  Mild sedation. At this level, you may feel awake and relaxed. You will be able to follow directions.  Moderate sedation. At this level, you will be sleepy. You may not remember the procedure.  Deep sedation. At this level, you will be asleep. You will  not remember the procedure.  The more medicine you are given, the deeper your level of sedation will be. Depending on how you respond to the procedure, the anesthesia specialist may change your level of sedation or the type of anesthesia to fit your needs. An anesthesia specialist will monitor you closely during the procedure. Let your health care provider know about:  Any allergies you have.  All medicines you are taking, including vitamins, herbs, eye drops, creams, and over-the-counter  medicines.  Any use of steroids (by mouth or as a cream).  Any problems you or family members have had with sedatives and anesthetic medicines.  Any blood disorders you have.  Any surgeries you have had.  Any medical conditions you have, such as sleep apnea.  Whether you are pregnant or may be pregnant.  Any use of cigarettes, alcohol, or street drugs. What are the risks? Generally, this is a safe procedure. However, problems may occur, including:  Getting too much medicine (oversedation).  Nausea.  Allergic reaction to medicines.  Trouble breathing. If this happens, a breathing tube may be used to help with breathing. It will be removed when you are awake and breathing on your own.  Heart trouble.  Lung trouble.  Before the procedure Staying hydrated Follow instructions from your health care provider about hydration, which may include:  Up to 2 hours before the procedure - you may continue to drink clear liquids, such as water, clear fruit juice, black coffee, and plain tea.  Eating and drinking restrictions Follow instructions from your health care provider about eating and drinking, which may include:  8 hours before the procedure - stop eating heavy meals or foods such as meat, fried foods, or fatty foods.  6 hours before the procedure - stop eating light meals or foods, such as toast or cereal.  6 hours before the procedure - stop drinking milk or drinks that contain milk.  2  hours before the procedure - stop drinking clear liquids.  Medicines Ask your health care provider about:  Changing or stopping your regular medicines. This is especially important if you are taking diabetes medicines or blood thinners.  Taking medicines such as aspirin and ibuprofen. These medicines can thin your blood. Do not take these medicines before your procedure if your health care provider instructs you not to.  Tests and exams  You will have a physical exam.  You may have blood tests done to show: ? How well your kidneys and liver are working. ? How well your blood can clot.  General instructions  Plan to have someone take you home from the hospital or clinic.  If you will be going home right after the procedure, plan to have someone with you for 24 hours.  What happens during the procedure?  Your blood pressure, heart rate, breathing, level of pain and overall condition will be monitored.  An IV tube will be inserted into one of your veins.  Your anesthesia specialist will give you medicines as needed to keep you comfortable during the procedure. This may mean changing the level of sedation.  The procedure will be performed. After the procedure  Your blood pressure, heart rate, breathing rate, and blood oxygen level will be monitored until the medicines you were given have worn off.  Do not drive for 24 hours if you received a sedative.  You may: ? Feel sleepy, clumsy, or nauseous. ? Feel forgetful about what happened after the procedure. ? Have a sore throat if you had a breathing tube during the procedure. ? Vomit. This information is not intended to replace advice given to you by your health care provider. Make sure you discuss any questions you have with your health care provider. Document Released: 11/24/2004 Document Revised: 08/07/2015 Document Reviewed: 06/21/2015 Elsevier Interactive Patient Education  2018 [place],  Care After These instructions provide you with information about caring for yourself after your procedure. Your health care provider may also give  you more specific instructions. Your treatment has been planned according to current medical practices, but problems sometimes occur. Call your health care provider if you have any problems or questions after your procedure. What can I expect after the procedure? After your procedure, it is common to:  Feel sleepy for several hours.  Feel clumsy and have poor balance for several hours.  Feel forgetful about what happened after the procedure.  Have poor judgment for several hours.  Feel nauseous or vomit.  Have a sore throat if you had a breathing tube during the procedure.  Follow these instructions at home: For at least 24 hours after the procedure:   Do not: ? Participate in activities in which you could fall or become injured. ? Drive. ? Use heavy machinery. ? Drink alcohol. ? Take sleeping pills or medicines that cause drowsiness. ? Make important decisions or sign legal documents. ? Take care of children on your own.  Rest. Eating and drinking  Follow the diet that is recommended by your health care provider.  If you vomit, drink water, juice, or soup when you can drink without vomiting.  Make sure you have little or no nausea before eating solid foods. General instructions  Have a responsible adult stay with you until you are awake and alert.  Take over-the-counter and prescription medicines only as told by your health care provider.  If you smoke, do not smoke without supervision.  Keep all follow-up visits as told by your health care provider. This is important. Contact a health care provider if:  You keep feeling nauseous or you keep vomiting.  You feel light-headed.  You develop a rash.  You have a fever. Get help right away if:  You have trouble breathing. This information is not intended to replace  advice given to you by your health care provider. Make sure you discuss any questions you have with your health care provider. Document Released: 06/21/2015 Document Revised: 10/21/2015 Document Reviewed: 06/21/2015 Elsevier Interactive Patient Education  [employer].

## 2016-11-10 DIAGNOSIS — G894 Chronic pain syndrome: Secondary | ICD-10-CM | POA: Diagnosis not present

## 2016-11-10 DIAGNOSIS — Z6834 Body mass index (BMI) 34.0-34.9, adult: Secondary | ICD-10-CM | POA: Diagnosis not present

## 2016-11-10 DIAGNOSIS — M1991 Primary osteoarthritis, unspecified site: Secondary | ICD-10-CM | POA: Diagnosis not present

## 2016-11-10 DIAGNOSIS — E669 Obesity, unspecified: Secondary | ICD-10-CM | POA: Diagnosis not present

## 2016-11-10 DIAGNOSIS — Z1389 Encounter for screening for other disorder: Secondary | ICD-10-CM | POA: Diagnosis not present

## 2016-11-16 ENCOUNTER — Encounter (HOSPITAL_COMMUNITY)
Admission: RE | Admit: 2016-11-16 | Discharge: 2016-11-16 | Disposition: A | Discharge status: Home / Self Care | Payer: BLUE CROSS/BLUE SHIELD | Source: Ambulatory Visit | Attending: Gastroenterology | Admitting: Gastroenterology

## 2016-11-16 ENCOUNTER — Encounter (HOSPITAL_COMMUNITY): Payer: Self-pay

## 2016-11-16 DIAGNOSIS — Z01810 Encounter for preprocedural cardiovascular examination: Secondary | ICD-10-CM | POA: Diagnosis not present

## 2016-11-16 DIAGNOSIS — I517 Cardiomegaly: Secondary | ICD-10-CM | POA: Insufficient documentation

## 2016-11-16 DIAGNOSIS — Z1211 Encounter for screening for malignant neoplasm of colon: Secondary | ICD-10-CM | POA: Insufficient documentation

## 2016-11-16 DIAGNOSIS — I4519 Other right bundle-branch block: Secondary | ICD-10-CM | POA: Diagnosis not present

## 2016-11-16 DIAGNOSIS — Z01818 Encounter for other preprocedural examination: Secondary | ICD-10-CM | POA: Diagnosis not present

## 2016-11-16 HISTORY — DX: Depression, unspecified: F32.A

## 2016-11-16 HISTORY — DX: Major depressive disorder, single episode, unspecified: F32.9

## 2016-11-16 HISTORY — DX: Hyperlipidemia, unspecified: E78.5

## 2016-11-16 LAB — CBC WITH DIFFERENTIAL/PLATELET
Basophils Absolute: 0 10*3/uL (ref 0.0–0.1)
Basophils Relative: 1 %
EOS PCT: 3 %
Eosinophils Absolute: 0.2 10*3/uL (ref 0.0–0.7)
HCT: 45 % (ref 39.0–52.0)
Hemoglobin: 15.1 g/dL (ref 13.0–17.0)
LYMPHS ABS: 3.1 10*3/uL (ref 0.7–4.0)
LYMPHS PCT: 36 %
MCH: 31.1 pg (ref 26.0–34.0)
MCHC: 33.6 g/dL (ref 30.0–36.0)
MCV: 92.6 fL (ref 78.0–100.0)
MONO ABS: 0.7 10*3/uL (ref 0.1–1.0)
MONOS PCT: 8 %
Neutro Abs: 4.6 10*3/uL (ref 1.7–7.7)
Neutrophils Relative %: 54 %
PLATELETS: 165 10*3/uL (ref 150–400)
RBC: 4.86 MIL/uL (ref 4.22–5.81)
RDW: 13.3 % (ref 11.5–15.5)
WBC: 8.6 10*3/uL (ref 4.0–10.5)

## 2016-11-16 LAB — BASIC METABOLIC PANEL
Anion gap: 9 (ref 5–15)
BUN: 15 mg/dL (ref 6–20)
CALCIUM: 9.1 mg/dL (ref 8.9–10.3)
CO2: 20 mmol/L — ABNORMAL LOW (ref 22–32)
Chloride: 109 mmol/L (ref 101–111)
Creatinine, Ser: 0.97 mg/dL (ref 0.61–1.24)
GFR calc Af Amer: >60 mL/min (ref >60)
GLUCOSE: 139 mg/dL — AB (ref 65–99)
Potassium: 3.9 mmol/L (ref 3.5–5.1)
Sodium: 138 mmol/L (ref 135–145)

## 2016-11-17 ENCOUNTER — Telehealth: Payer: Self-pay

## 2016-11-17 NOTE — Telephone Encounter (Signed)
Called and spoke to pt's wife. Asked if he could arrive at 8:00am 11/22/16 for tcs d/t we had a cancellation. Pt's wife said no and wanted to keep his procedure time like it is. Called and informed Endo Scheduler.

## 2016-11-22 ENCOUNTER — Ambulatory Visit (HOSPITAL_COMMUNITY)
Admission: RE | Admit: 2016-11-22 | Discharge: 2016-11-22 | Disposition: A | Discharge status: Home / Self Care | Payer: BLUE CROSS/BLUE SHIELD | Source: Ambulatory Visit | Attending: Gastroenterology | Admitting: Gastroenterology

## 2016-11-22 ENCOUNTER — Encounter (HOSPITAL_COMMUNITY)
Admission: RE | Disposition: A | Discharge status: Home / Self Care | Payer: Self-pay | Source: Ambulatory Visit | Attending: Gastroenterology

## 2016-11-22 ENCOUNTER — Encounter (HOSPITAL_COMMUNITY): Payer: Self-pay | Admitting: *Deleted

## 2016-11-22 ENCOUNTER — Ambulatory Visit (HOSPITAL_COMMUNITY): Payer: BLUE CROSS/BLUE SHIELD | Admitting: Anesthesiology

## 2016-11-22 DIAGNOSIS — M1990 Unspecified osteoarthritis, unspecified site: Secondary | ICD-10-CM | POA: Insufficient documentation

## 2016-11-22 DIAGNOSIS — D123 Benign neoplasm of transverse colon: Secondary | ICD-10-CM | POA: Insufficient documentation

## 2016-11-22 DIAGNOSIS — F329 Major depressive disorder, single episode, unspecified: Secondary | ICD-10-CM | POA: Insufficient documentation

## 2016-11-22 DIAGNOSIS — I129 Hypertensive chronic kidney disease with stage 1 through stage 4 chronic kidney disease, or unspecified chronic kidney disease: Secondary | ICD-10-CM | POA: Insufficient documentation

## 2016-11-22 DIAGNOSIS — E785 Hyperlipidemia, unspecified: Secondary | ICD-10-CM | POA: Diagnosis not present

## 2016-11-22 DIAGNOSIS — N189 Chronic kidney disease, unspecified: Secondary | ICD-10-CM | POA: Insufficient documentation

## 2016-11-22 DIAGNOSIS — Z79899 Other long term (current) drug therapy: Secondary | ICD-10-CM | POA: Insufficient documentation

## 2016-11-22 DIAGNOSIS — Z1211 Encounter for screening for malignant neoplasm of colon: Secondary | ICD-10-CM | POA: Diagnosis not present

## 2016-11-22 DIAGNOSIS — Z7982 Long term (current) use of aspirin: Secondary | ICD-10-CM | POA: Insufficient documentation

## 2016-11-22 DIAGNOSIS — K5730 Diverticulosis of large intestine without perforation or abscess without bleeding: Secondary | ICD-10-CM | POA: Insufficient documentation

## 2016-11-22 DIAGNOSIS — Z1212 Encounter for screening for malignant neoplasm of rectum: Secondary | ICD-10-CM

## 2016-11-22 DIAGNOSIS — K644 Residual hemorrhoidal skin tags: Secondary | ICD-10-CM | POA: Insufficient documentation

## 2016-11-22 DIAGNOSIS — K635 Polyp of colon: Secondary | ICD-10-CM | POA: Diagnosis not present

## 2016-11-22 HISTORY — PX: COLONOSCOPY WITH PROPOFOL: SHX5780

## 2016-11-22 SURGERY — COLONOSCOPY WITH PROPOFOL
Anesthesia: Monitor Anesthesia Care

## 2016-11-22 MED ORDER — PROPOFOL 500 MG/50ML IV EMUL
INTRAVENOUS | Status: DC | PRN
  Administered 2016-11-22: 125 ug/kg/min via INTRAVENOUS

## 2016-11-22 MED ORDER — CHLORHEXIDINE GLUCONATE CLOTH 2 % EX PADS: 6.0000 | MEDICATED_PAD | Freq: Once | CUTANEOUS | Status: DC

## 2016-11-22 MED ORDER — FENTANYL CITRATE (PF) 100 MCG/2ML IJ SOLN
25.0000 ug | Freq: Once | INTRAMUSCULAR | Status: AC
  Administered 2016-11-22: 25 ug via INTRAVENOUS

## 2016-11-22 MED ORDER — MIDAZOLAM HCL 2 MG/2ML IJ SOLN
1.0000 mg | INTRAMUSCULAR | Status: AC
Start: 2016-11-22 — End: 2016-11-22
  Administered 2016-11-22 (×2): 2 mg via INTRAVENOUS
  Filled 2016-11-22: qty 2

## 2016-11-22 MED ORDER — PROPOFOL 10 MG/ML IV BOLUS
INTRAVENOUS | Status: AC
  Filled 2016-11-22: qty 20

## 2016-11-22 MED ORDER — FENTANYL CITRATE (PF) 100 MCG/2ML IJ SOLN
INTRAMUSCULAR | Status: AC
  Filled 2016-11-22: qty 2

## 2016-11-22 MED ORDER — MIDAZOLAM HCL 2 MG/2ML IJ SOLN
INTRAMUSCULAR | Status: AC
  Filled 2016-11-22: qty 2

## 2016-11-22 MED ORDER — LACTATED RINGERS IV SOLN
INTRAVENOUS | Status: DC
  Administered 2016-11-22: 10:00:00 via INTRAVENOUS

## 2016-11-22 NOTE — Discharge Instructions (Signed)
You have MODERATE EXTERNAL hemorrhoids. YOU HAVE diverticulosis IN YOUR RIGHT AND LEFT COLON. YOU HAD ONE SMALL POLYP REMOVED. THE LAST PART OF YOUR SMALL BOWEL IS NORMAL.   DRINK WATER TO KEEP YOUR URINE LIGHT YELLOW.  FOLLOW A HIGH FIBER DIET. AVOID ITEMS THAT CAUSE BLOATING. See info below.  CONTINUE YOUR WEIGHT LOSS EFFORTS. YOUR BODY MASS INDEX IS OVER 30 WHICH MEANS YOU ARE OBESE. OBESITY ACTIVATES CANCER GENES. OBESITY IS ASSOCIATED WITH AN INCREASE FOR ALL CANCERS, INCLUDING COLON CANCER. A WEIGHT OF 185 LBS  WILL GET YOUR BODY MASS INDEX(BMI) UNDER 30.   USE PREPARATION H FOUR TIMES  A DAY IF NEEDED TO RELIEVE RECTAL PAIN/PRESSURE/BLEEDING.  YOUR BIOPSY RESULTS WILL BE AVAILABLE IN MY CHART  SEP 14 AND MY OFFICE WILL CONTACT YOU IN 10-14 DAYS WITH YOUR RESULTS.   Next colonoscopy in 5-10 years.  Colonoscopy Care After Read the instructions outlined below and refer to this sheet in the next week. These discharge instructions provide you with general information on caring for yourself after you leave the hospital. While your treatment has been planned according to the most current medical practices available, unavoidable complications occasionally occur. If you have any problems or questions after discharge, call DR. Jaisen Wiltrout, [phone].  ACTIVITY  You may resume your regular activity, but move at a slower pace for the next 24 hours.   Take frequent rest periods for the next 24 hours.   Walking will help get rid of the air and reduce the bloated feeling in your belly (abdomen).   No driving for 24 hours (because of the medicine (anesthesia) used during the test).   You may shower.   Do not sign any important legal documents or operate any machinery for 24 hours (because of the anesthesia used during the test).    NUTRITION  Drink plenty of fluids.   You may resume your normal diet as instructed by your doctor.   Begin with a light meal and progress to your normal diet.  Heavy or fried foods are harder to digest and may make you feel sick to your stomach (nauseated).   Avoid alcoholic beverages for 24 hours or as instructed.    MEDICATIONS  You may resume your normal medications.   WHAT YOU CAN EXPECT TODAY  Some feelings of bloating in the abdomen.   Passage of more gas than usual.   Spotting of blood in your stool or on the toilet paper  .  IF YOU HAD POLYPS REMOVED DURING THE COLONOSCOPY:  Eat a soft diet IF YOU HAVE NAUSEA, BLOATING, ABDOMINAL PAIN, OR VOMITING.    FINDING OUT THE RESULTS OF YOUR TEST Not all test results are available during your visit. DR. Oneida Alar WILL CALL YOU WITHIN 14 DAYS OF YOUR PROCEDUE WITH YOUR RESULTS. Do not assume everything is normal if you have not heard from DR. Destiney Sanabia, CALL HER OFFICE AT [phone].  SEEK IMMEDIATE MEDICAL ATTENTION AND CALL THE OFFICE: [phone] IF:  You have more than a spotting of blood in your stool.   Your belly is swollen (abdominal distention).   You are nauseated or vomiting.   You have a temperature over 101F.   You have abdominal pain or discomfort that is severe or gets worse throughout the day.  High-Fiber Diet A high-fiber diet changes your normal diet to include more whole grains, legumes, fruits, and vegetables. Changes in the diet involve replacing refined carbohydrates with unrefined foods. The calorie level of the diet is essentially  unchanged. The Dietary Reference Intake (recommended amount) for adult males is 38 grams per day. For adult females, it is 25 grams per day. Pregnant and lactating women should consume 28 grams of fiber per day. Fiber is the intact part of a plant that is not broken down during digestion. Functional fiber is fiber that has been isolated from the plant to provide a beneficial effect in the body. PURPOSE  Increase stool bulk.   Ease and regulate bowel movements.   Lower cholesterol.  REDUCE RISK OF COLON CANCER  INDICATIONS THAT  YOU NEED MORE FIBER  Constipation and hemorrhoids.   Uncomplicated diverticulosis (intestine condition) and irritable bowel syndrome.   Weight management.   As a protective measure against hardening of the arteries (atherosclerosis), diabetes, and cancer.   GUIDELINES FOR INCREASING FIBER IN THE DIET  Start adding fiber to the diet slowly. A gradual increase of about 5 more grams (2 slices of whole-wheat bread, 2 servings of most fruits or vegetables, or 1 bowl of high-fiber cereal) per day is best. Too rapid an increase in fiber may result in constipation, flatulence, and bloating.   Drink enough water and fluids to keep your urine clear or pale yellow. Water, juice, or caffeine-free drinks are recommended. Not drinking enough fluid may cause constipation.   Eat a variety of high-fiber foods rather than one type of fiber.   Try to increase your intake of fiber through using high-fiber foods rather than fiber pills or supplements that contain small amounts of fiber.   The goal is to change the types of food eaten. Do not supplement your present diet with high-fiber foods, but replace foods in your present diet.   INCLUDE A VARIETY OF FIBER SOURCES  Replace refined and processed grains with whole grains, canned fruits with fresh fruits, and incorporate other fiber sources. White rice, white breads, and most bakery goods contain little or no fiber.   Brown whole-grain rice, buckwheat oats, and many fruits and vegetables are all good sources of fiber. These include: broccoli, Brussels sprouts, cabbage, cauliflower, beets, sweet potatoes, white potatoes (skin on), carrots, tomatoes, eggplant, squash, berries, fresh fruits, and dried fruits.   Cereals appear to be the richest source of fiber. Cereal fiber is found in whole grains and bran. Bran is the fiber-rich outer coat of cereal grain, which is largely removed in refining. In whole-grain cereals, the bran remains. In breakfast cereals, the  largest amount of fiber is found in those with "bran" in their names. The fiber content is sometimes indicated on the label.   You may need to include additional fruits and vegetables each day.   In baking, for 1 cup white flour, you may use the following substitutions:   1 cup whole-wheat flour minus 2 tablespoons.   1/2 cup white flour plus 1/2 cup whole-wheat flour.   Polyps, Colon  A polyp is extra tissue that grows inside your body. Colon polyps grow in the large intestine. The large intestine, also called the colon, is part of your digestive system. It is a long, hollow tube at the end of your digestive tract where your body makes and stores stool. Most polyps are not dangerous. They are benign. This means they are not cancerous. But over time, some types of polyps can turn into cancer. Polyps that are smaller than a pea are usually not harmful. But larger polyps could someday become or may already be cancerous. To be safe, doctors remove all polyps and test them.  PREVENTION There is not one sure way to prevent polyps. You might be able to lower your risk of getting them if you:  Eat more fruits and vegetables and less fatty food.   Do not smoke.   Avoid alcohol.   Exercise every day.   Lose weight if you are overweight.   Eating more calcium and folate can also lower your risk of getting polyps. Some foods that are rich in calcium are milk, cheese, and broccoli. Some foods that are rich in folate are chickpeas, kidney beans, and spinach.    Diverticulosis Diverticulosis is a common condition that develops when small pouches (diverticula) form in the wall of the colon. The risk of diverticulosis increases with age. It happens more often in people who eat a low-fiber diet. Most individuals with diverticulosis have no symptoms. Those individuals with symptoms usually experience belly (abdominal) pain, constipation, or loose stools (diarrhea).  HOME CARE INSTRUCTIONS  Increase  the amount of fiber in your diet as directed by your caregiver or dietician. This may reduce symptoms of diverticulosis.   Drink at least 6 to 8 glasses of water each day to prevent constipation.   Try not to strain when you have a bowel movement.   Avoiding nuts and seeds to prevent complications is NOT NECESSARY.     FOODS HAVING HIGH FIBER CONTENT INCLUDE:  Fruits. Apple, peach, pear, tangerine, raisins, prunes.   Vegetables. Brussels sprouts, asparagus, broccoli, cabbage, carrot, cauliflower, romaine lettuce, spinach, summer squash, tomato, winter squash, zucchini.   Starchy Vegetables. Baked beans, kidney beans, lima beans, split peas, lentils, potatoes (with skin).   Grains. Whole wheat bread, brown rice, bran flake cereal, plain oatmeal, white rice, shredded wheat, bran muffins.    SEEK IMMEDIATE MEDICAL CARE IF:  You develop increasing pain or severe bloating.   You have an oral temperature above 101F.   You develop vomiting or bowel movements that are bloody or black.   Hemorrhoids Hemorrhoids are dilated (enlarged) veins around the rectum. Sometimes clots will form in the veins. This makes them swollen and painful. These are called thrombosed hemorrhoids. Causes of hemorrhoids include:  Constipation.   Straining to have a bowel movement.   HEAVY LIFTING  HOME CARE INSTRUCTIONS  Eat a well balanced diet and drink 6 to 8 glasses of water every day to avoid constipation. You may also use a bulk laxative.   Avoid straining to have bowel movements.   Keep anal area dry and clean.   Do not use a donut shaped pillow or sit on the toilet for long periods. This increases blood pooling and pain.   Move your bowels when your body has the urge; this will require less straining and will decrease pain and pressure.

## 2016-11-22 NOTE — Anesthesia Postprocedure Evaluation (Signed)
Anesthesia Post Note  Patient: Patrick Rollins  Procedure(s) Performed: Procedure(s) (LRB): COLONOSCOPY WITH PROPOFOL (N/A)  Patient location during evaluation: PACU Anesthesia Type: MAC Level of consciousness: awake and patient cooperative Pain management: pain level controlled Vital Signs Assessment: post-procedure vital signs reviewed and stable Respiratory status: spontaneous breathing, nonlabored ventilation and respiratory function stable Cardiovascular status: blood pressure returned to baseline Postop Assessment: no signs of nausea or vomiting Anesthetic complications: no     Last Vitals:  Vitals:   11/22/16 1030 11/22/16 1031  BP: 106/65 106/65  Pulse:    Resp: 14 15  Temp:    SpO2: 96% 96%    Last Pain:  Vitals:   11/22/16 0919  TempSrc: Oral                 Melvina Pangelinan J

## 2016-11-22 NOTE — Anesthesia Preprocedure Evaluation (Signed)
Anesthesia Evaluation  Patient identified by MRN, date of birth, ID band Patient awake    Reviewed: Allergy & Precautions, NPO status , Patient's Chart, lab work & pertinent test results  Airway Mallampati: II  TM Distance: >3 FB     Dental  (+) Poor Dentition, Chipped, Missing,    Pulmonary Current Smoker,    breath sounds clear to auscultation       Cardiovascular hypertension, Pt. on medications  Rhythm:Regular Rate:Normal     Neuro/Psych PSYCHIATRIC DISORDERS Depression    GI/Hepatic   Endo/Other    Renal/GU Renal disease     Musculoskeletal  (+) Arthritis ,   Abdominal   Peds  Hematology   Anesthesia Other Findings   Reproductive/Obstetrics                             Anesthesia Physical Anesthesia Plan  ASA: II  Anesthesia Plan: MAC   Post-op Pain Management:    Induction: Intravenous  PONV Risk Score and Plan:   Airway Management Planned: Simple Face Mask  Additional Equipment:   Intra-op Plan:   Post-operative Plan:   Informed Consent: I have reviewed the patients History and Physical, chart, labs and discussed the procedure including the risks, benefits and alternatives for the proposed anesthesia with the patient or authorized representative who has indicated his/her understanding and acceptance.     Plan Discussed with:   Anesthesia Plan Comments:         Anesthesia Quick Evaluation

## 2016-11-22 NOTE — H&P (Signed)
Primary Care Physician:  Redmond School, MD Primary Gastroenterologist:  Dr. Oneida Alar  Pre-Procedure History & Physical: HPI:  Patrick Rollins is a 51 y.o. male here for COLON CANCER SCREENING.  Past Medical History:  Diagnosis Date  . 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 Chronic kidney disease    Kidney stones  . Depression   . Hyperlipidemia   . Hypertension 2013  . Kidney stones     Past Surgical History:  Procedure Laterality Date  . car [place]  . EXTRACORPOREAL SHOCK WAVE LITHOTRIPSY  06/15/2011   Procedure: EXTRACORPOREAL SHOCK WAVE LITHOTRIPSY (ESWL);  Surgeon: Marissa Nestle, MD;  Location: [facility];  Service: Urology;  Laterality: Right;  ESWL Right Renal Calculus    Prior to Admission medications   Medication Sig Start Date End Date Taking? Authorizing Provider  aspirin EC 81 MG tablet Take 81 mg by mouth daily.   Yes [provider]  atorvastatin (LIPITOR) 20 MG tablet Take 20 mg by mouth daily.   Yes [provider]  gemfibrozil (LOPID) 600 MG tablet Take 600 mg by mouth 2 (two) times daily before a meal.   Yes [provider]  lisinopril (PRINIVIL,ZESTRIL) 10 MG tablet Take 10 mg by mouth daily.   Yes [provider]  naproxen (NAPROSYN) 500 MG tablet Take 500 mg by mouth 2 (two) times daily with a meal.   Yes [provider]  oxyCODONE-acetaminophen (PERCOCET) 7.5-325 MG per tablet Take 1 tablet by mouth every 6 (six) hours as needed for moderate pain.  06/13/11  Yes [provider]  polyethylene glycol-electrolytes (TRILYTE) 420 g solution Take 4,000 mLs by mouth as directed. 11/01/16  Yes Rourk, Cristopher Estimable, MD  Pseudoeph-Doxylamine-DM-APAP (NYQUIL PO) Take 1 capsule by mouth at bedtime as needed (sleep).   Yes [provider]    Allergies as of 11/01/2016 - Review Complete 11/01/2016  Allergen Reaction Noted  . Penicillins Hives 06/09/2011    Family History  Problem Relation Age of Onset  . Cancer Father        ?lung  . Muscular dystrophy Son        died age 87  . Breast cancer Paternal Aunt   . Breast cancer Other   . Colon cancer Neg Hx     Social History   Social History  . Marital status: Married    Spouse name: N/A  . Number of children: N/A  . Years of education: N/A   Occupational History  . Not on file.   Social History Main Topics  . Smoking status: Current Every Day Smoker    Packs/day: 1.50    Years: 30.00    Types: Cigarettes  . Smokeless tobacco: Never Used  . Alcohol use Yes     Comment: few drinks on saturdays  . Drug use: No  . Sexual activity: Yes   Other Topics Concern  . Not on file   Social History Narrative  . No narrative on file    Review of Systems: See HPI, otherwise negative ROS   Physical Exam: BP 106/65   Pulse 85   Temp 98.4 F (36.9 C) (Oral)   Resp 15   SpO2 96%  General:   Alert,  pleasant and cooperative in NAD Head:  Normocephalic and atraumatic. Neck:  Supple; Lungs:  Clear throughout to auscultation.    Heart:  Regular rate and rhythm. Abdomen:  Soft, nontender and nondistended. Normal bowel sounds, without guarding, and without rebound.  Neurologic:  Alert and  oriented x4;  grossly normal neurologically.  Impression/Plan:     SCREENING  Plan:  1. TCS TODAY. DISCUSSED PROCEDURE, BENEFITS, & RISKS: < 1% chance of medication reaction, bleeding, perforation, or rupture of spleen/liver.

## 2016-11-22 NOTE — Op Note (Signed)
[facility] Patient Name: Patrick Rollins Procedure Date: 11/22/2016 10:48 AM MRN: [id number] Date of Birth: [birth year] Attending MD: Barney Drain MD, MD CSN: [id number] Age: 51 Admit Type: Outpatient Procedure:                Colonoscopy WITH COLD FORCEPS POLYPECTOMY Indications:              Screening for colorectal malignant neoplasm Providers:                Barney Drain MD, MD, Janeece Riggers, RN, Lurline Del, RN Referring MD:             Redmond School, MD Medicines:                Propofol per Anesthesia Complications:            No immediate complications. Estimated Blood Loss:     Estimated blood loss was minimal. Procedure:                Pre-Anesthesia Assessment:                           - Prior to the procedure, a History and Physical                            was performed, and patient medications and                            allergies were reviewed. The patient's tolerance of                            previous anesthesia was also reviewed. The risks                            and benefits of the procedure and the sedation                            options and risks were discussed with the patient.                            All questions were answered, and informed consent                            was obtained. Prior Anticoagulants: The patient has                            taken aspirin, last dose was day of procedure. ASA                            Grade Assessment: II - A patient with mild systemic                            disease. After reviewing the risks and benefits,                            the patient was deemed in satisfactory condition to  undergo the procedure. After obtaining informed                            consent, the colonoscope was passed under direct                            vision. Throughout the procedure, the patient's                            blood pressure, pulse, and oxygen saturations were                             monitored continuously. The EC-[id number] ([id number])                            scope was introduced through the anus and advanced                            to the the cecum, identified by appendiceal orifice                            and ileocecal valve. The colonoscopy was somewhat                            difficult due to significant looping. Successful                            completion of the procedure was aided by COLOWRAP.                            The patient tolerated the procedure well. The                            quality of the bowel preparation was good. The                            ileocecal valve, appendiceal orifice, and rectum                            were photographed. Scope In: 11:01:34 AM Scope Out: 11:20:15 AM Scope Withdrawal Time: 0 hours 16 minutes 44 seconds  Total Procedure Duration: 0 hours 18 minutes 41 seconds  Findings:      A 3 mm polyp was found in the proximal transverse colon. The polyp was       sessile. The polyp was removed with a cold biopsy forceps. Resection and       retrieval were complete.      Multiple small and large-mouthed diverticula were found in the       recto-sigmoid colon, sigmoid colon, descending colon, proximal       transverse colon and proximal ascending colon.      External hemorrhoids were found during retroflexion. The hemorrhoids       were moderate. Impression:               - One 3 mm polyp in the  proximal transverse colon,                            removed with a cold biopsy forceps. Resected and                            retrieved.                           - Diverticulosis in the recto-sigmoid colon, in the                            sigmoid colon, in the descending colon, in the                            proximal transverse colon and in the proximal                            ascending colon.                           - External hemorrhoids. Moderate Sedation:      Per Anesthesia  Care Recommendation:           - Repeat colonoscopy in 5-10 years for surveillance.                           - High fiber diet.                           - Continue present medications.                           - Await pathology results.                           - Patient has a contact number available for                            emergencies. The signs and symptoms of potential                            delayed complications were discussed with the                            patient. Return to normal activities tomorrow.                            Written discharge instructions were provided to the                            patient. Procedure Code(s):        --- Professional ---                           [phone], Colonoscopy, flexible; with biopsy, single  or multiple Diagnosis Code(s):        --- Professional ---                           Z12.11, Encounter for screening for malignant                            neoplasm of colon                           D12.3, Benign neoplasm of transverse colon (hepatic                            flexure or splenic flexure)                           K64.4, Residual hemorrhoidal skin tags                           K57.30, Diverticulosis of large intestine without                            perforation or abscess without bleeding CPT copyright 2016 [employer]. All rights reserved. The codes documented in this report are preliminary and upon coder review may  be revised to meet current compliance requirements. Barney Drain, MD Barney Drain MD, MD 11/22/2016 11:33:23 AM This report has been signed electronically. Number of Addenda: 0

## 2016-11-22 NOTE — Transfer of Care (Signed)
Immediate Anesthesia Transfer of Care Note  Patient: Patrick Rollins  Procedure(s) Performed: Procedure(s) with comments: COLONOSCOPY WITH PROPOFOL (N/A) - 10:00am - pt refused to move  Patient Location: PACU  Anesthesia Type:MAC  Level of Consciousness: awake and patient cooperative  Airway & Oxygen Therapy: Patient Spontanous Breathing  Post-op Assessment: Report given to RN, Post -op Vital signs reviewed and stable and Patient moving all extremities  Post vital signs: Reviewed and stable  Last Vitals:  Vitals:   11/22/16 1030 11/22/16 1031  BP: 106/65 106/65  Pulse:    Resp: 14 15  Temp:    SpO2: 96% 96%    Last Pain:  Vitals:   11/22/16 0919  TempSrc: Oral      Patients Stated Pain Goal: 4 (16/38/46 6599)  Complications: No apparent anesthesia complications

## 2016-11-23 ENCOUNTER — Telehealth: Payer: Self-pay | Admitting: Gastroenterology

## 2016-11-23 NOTE — Telephone Encounter (Signed)
REMINDER IN EPIC °

## 2016-11-23 NOTE — Telephone Encounter (Signed)
Pt said that he was returning a call from DS. I told him DS was not available and she would have to call him back. [phone]

## 2016-11-23 NOTE — Telephone Encounter (Signed)
See result note. Pt is aware.  

## 2016-11-23 NOTE — Telephone Encounter (Signed)
Please call pt. HE had ONE simple adenoma removed from HIS colon. FOLLOW A HIGH FIBER DIET. NEXT TCS IN 5-10 YEARS.

## 2016-11-23 NOTE — Telephone Encounter (Signed)
Pt is aware.  

## 2016-11-23 NOTE — Telephone Encounter (Signed)
LMOM to call.

## 2016-11-24 ENCOUNTER — Encounter (HOSPITAL_COMMUNITY): Payer: Self-pay | Admitting: Gastroenterology

## 2017-01-30 DIAGNOSIS — E6609 Other obesity due to excess calories: Secondary | ICD-10-CM | POA: Diagnosis not present

## 2017-01-30 DIAGNOSIS — Z719 Counseling, unspecified: Secondary | ICD-10-CM | POA: Diagnosis not present

## 2017-01-30 DIAGNOSIS — G894 Chronic pain syndrome: Secondary | ICD-10-CM | POA: Diagnosis not present

## 2017-01-30 DIAGNOSIS — Z6834 Body mass index (BMI) 34.0-34.9, adult: Secondary | ICD-10-CM | POA: Diagnosis not present

## 2017-01-30 DIAGNOSIS — Z23 Encounter for immunization: Secondary | ICD-10-CM | POA: Diagnosis not present

## 2017-01-30 DIAGNOSIS — Z1389 Encounter for screening for other disorder: Secondary | ICD-10-CM | POA: Diagnosis not present

## 2017-01-30 DIAGNOSIS — F17290 Nicotine dependence, other tobacco product, uncomplicated: Secondary | ICD-10-CM | POA: Diagnosis not present

## 2017-01-30 DIAGNOSIS — E669 Obesity, unspecified: Secondary | ICD-10-CM | POA: Diagnosis not present

## 2017-01-30 DIAGNOSIS — I10 Essential (primary) hypertension: Secondary | ICD-10-CM | POA: Diagnosis not present

## 2017-05-11 DIAGNOSIS — Z6834 Body mass index (BMI) 34.0-34.9, adult: Secondary | ICD-10-CM | POA: Diagnosis not present

## 2017-05-11 DIAGNOSIS — Z1389 Encounter for screening for other disorder: Secondary | ICD-10-CM | POA: Diagnosis not present

## 2017-05-11 DIAGNOSIS — E6609 Other obesity due to excess calories: Secondary | ICD-10-CM | POA: Diagnosis not present

## 2017-05-11 DIAGNOSIS — Z87442 Personal history of urinary calculi: Secondary | ICD-10-CM | POA: Diagnosis not present

## 2017-05-11 DIAGNOSIS — M1991 Primary osteoarthritis, unspecified site: Secondary | ICD-10-CM | POA: Diagnosis not present

## 2017-05-11 DIAGNOSIS — G894 Chronic pain syndrome: Secondary | ICD-10-CM | POA: Diagnosis not present

## 2017-05-11 DIAGNOSIS — M4696 Unspecified inflammatory spondylopathy, lumbar region: Secondary | ICD-10-CM | POA: Diagnosis not present

## 2017-08-04 DIAGNOSIS — M1991 Primary osteoarthritis, unspecified site: Secondary | ICD-10-CM | POA: Diagnosis not present

## 2017-08-04 DIAGNOSIS — N200 Calculus of kidney: Secondary | ICD-10-CM | POA: Diagnosis not present

## 2017-08-04 DIAGNOSIS — N23 Unspecified renal colic: Secondary | ICD-10-CM | POA: Diagnosis not present

## 2017-08-04 DIAGNOSIS — E6609 Other obesity due to excess calories: Secondary | ICD-10-CM | POA: Diagnosis not present

## 2017-08-04 DIAGNOSIS — I10 Essential (primary) hypertension: Secondary | ICD-10-CM | POA: Diagnosis not present

## 2017-08-04 DIAGNOSIS — G894 Chronic pain syndrome: Secondary | ICD-10-CM | POA: Diagnosis not present

## 2017-08-04 DIAGNOSIS — Z1389 Encounter for screening for other disorder: Secondary | ICD-10-CM | POA: Diagnosis not present

## 2017-08-04 DIAGNOSIS — Z6833 Body mass index (BMI) 33.0-33.9, adult: Secondary | ICD-10-CM | POA: Diagnosis not present

## 2017-11-03 DIAGNOSIS — Z1389 Encounter for screening for other disorder: Secondary | ICD-10-CM | POA: Diagnosis not present

## 2017-11-03 DIAGNOSIS — E669 Obesity, unspecified: Secondary | ICD-10-CM | POA: Diagnosis not present

## 2017-11-03 DIAGNOSIS — I10 Essential (primary) hypertension: Secondary | ICD-10-CM | POA: Diagnosis not present

## 2017-11-03 DIAGNOSIS — G894 Chronic pain syndrome: Secondary | ICD-10-CM | POA: Diagnosis not present

## 2017-11-03 DIAGNOSIS — M4696 Unspecified inflammatory spondylopathy, lumbar region: Secondary | ICD-10-CM | POA: Diagnosis not present

## 2017-11-03 DIAGNOSIS — Z0001 Encounter for general adult medical examination with abnormal findings: Secondary | ICD-10-CM | POA: Diagnosis not present

## 2017-11-06 DIAGNOSIS — Z1389 Encounter for screening for other disorder: Secondary | ICD-10-CM | POA: Diagnosis not present

## 2017-11-06 DIAGNOSIS — E782 Mixed hyperlipidemia: Secondary | ICD-10-CM | POA: Diagnosis not present

## 2018-01-26 DIAGNOSIS — Z1389 Encounter for screening for other disorder: Secondary | ICD-10-CM | POA: Diagnosis not present

## 2018-01-26 DIAGNOSIS — M4696 Unspecified inflammatory spondylopathy, lumbar region: Secondary | ICD-10-CM | POA: Diagnosis not present

## 2018-01-26 DIAGNOSIS — Z6833 Body mass index (BMI) 33.0-33.9, adult: Secondary | ICD-10-CM | POA: Diagnosis not present

## 2018-01-26 DIAGNOSIS — G894 Chronic pain syndrome: Secondary | ICD-10-CM | POA: Diagnosis not present

## 2018-01-26 DIAGNOSIS — E669 Obesity, unspecified: Secondary | ICD-10-CM | POA: Diagnosis not present

## 2018-01-26 DIAGNOSIS — I10 Essential (primary) hypertension: Secondary | ICD-10-CM | POA: Diagnosis not present

## 2018-01-26 DIAGNOSIS — Z23 Encounter for immunization: Secondary | ICD-10-CM | POA: Diagnosis not present

## 2018-04-13 DIAGNOSIS — G894 Chronic pain syndrome: Secondary | ICD-10-CM | POA: Diagnosis not present

## 2018-04-13 DIAGNOSIS — I10 Essential (primary) hypertension: Secondary | ICD-10-CM | POA: Diagnosis not present

## 2018-04-13 DIAGNOSIS — Z1389 Encounter for screening for other disorder: Secondary | ICD-10-CM | POA: Diagnosis not present

## 2018-04-13 DIAGNOSIS — R739 Hyperglycemia, unspecified: Secondary | ICD-10-CM | POA: Diagnosis not present

## 2018-04-13 DIAGNOSIS — Z23 Encounter for immunization: Secondary | ICD-10-CM | POA: Diagnosis not present

## 2018-04-13 DIAGNOSIS — H8113 Benign paroxysmal vertigo, bilateral: Secondary | ICD-10-CM | POA: Diagnosis not present

## 2018-04-13 DIAGNOSIS — R7303 Prediabetes: Secondary | ICD-10-CM | POA: Diagnosis not present

## 2018-04-13 DIAGNOSIS — K602 Anal fissure, unspecified: Secondary | ICD-10-CM | POA: Diagnosis not present

## 2018-04-13 DIAGNOSIS — Z6833 Body mass index (BMI) 33.0-33.9, adult: Secondary | ICD-10-CM | POA: Diagnosis not present

## 2018-05-04 DIAGNOSIS — K601 Chronic anal fissure: Secondary | ICD-10-CM | POA: Diagnosis not present

## 2018-05-04 DIAGNOSIS — G894 Chronic pain syndrome: Secondary | ICD-10-CM | POA: Diagnosis not present

## 2018-05-04 DIAGNOSIS — Z1389 Encounter for screening for other disorder: Secondary | ICD-10-CM | POA: Diagnosis not present

## 2018-05-04 DIAGNOSIS — M4696 Unspecified inflammatory spondylopathy, lumbar region: Secondary | ICD-10-CM | POA: Diagnosis not present

## 2018-05-04 DIAGNOSIS — Z6834 Body mass index (BMI) 34.0-34.9, adult: Secondary | ICD-10-CM | POA: Diagnosis not present

## 2018-05-28 DIAGNOSIS — H25042 Posterior subcapsular polar age-related cataract, left eye: Secondary | ICD-10-CM | POA: Diagnosis not present

## 2018-05-29 ENCOUNTER — Ambulatory Visit: Payer: BLUE CROSS/BLUE SHIELD | Admitting: General Surgery

## 2018-06-21 DIAGNOSIS — M4696 Unspecified inflammatory spondylopathy, lumbar region: Secondary | ICD-10-CM | POA: Diagnosis not present

## 2018-06-21 DIAGNOSIS — E6609 Other obesity due to excess calories: Secondary | ICD-10-CM | POA: Diagnosis not present

## 2018-06-21 DIAGNOSIS — G894 Chronic pain syndrome: Secondary | ICD-10-CM | POA: Diagnosis not present

## 2018-06-21 DIAGNOSIS — Z6834 Body mass index (BMI) 34.0-34.9, adult: Secondary | ICD-10-CM | POA: Diagnosis not present

## 2018-07-18 DIAGNOSIS — H25042 Posterior subcapsular polar age-related cataract, left eye: Secondary | ICD-10-CM | POA: Diagnosis not present

## 2018-07-18 DIAGNOSIS — H25812 Combined forms of age-related cataract, left eye: Secondary | ICD-10-CM | POA: Diagnosis not present

## 2018-07-18 DIAGNOSIS — H2512 Age-related nuclear cataract, left eye: Secondary | ICD-10-CM | POA: Diagnosis not present

## 2018-07-18 DIAGNOSIS — Z01818 Encounter for other preprocedural examination: Secondary | ICD-10-CM | POA: Diagnosis not present

## 2018-07-19 DIAGNOSIS — G894 Chronic pain syndrome: Secondary | ICD-10-CM | POA: Diagnosis not present

## 2018-08-10 DIAGNOSIS — H25811 Combined forms of age-related cataract, right eye: Secondary | ICD-10-CM | POA: Diagnosis not present

## 2018-08-10 DIAGNOSIS — H25041 Posterior subcapsular polar age-related cataract, right eye: Secondary | ICD-10-CM | POA: Diagnosis not present

## 2018-08-10 DIAGNOSIS — H2511 Age-related nuclear cataract, right eye: Secondary | ICD-10-CM | POA: Diagnosis not present

## 2018-08-16 DIAGNOSIS — G894 Chronic pain syndrome: Secondary | ICD-10-CM | POA: Diagnosis not present

## 2018-08-16 DIAGNOSIS — M4696 Unspecified inflammatory spondylopathy, lumbar region: Secondary | ICD-10-CM | POA: Diagnosis not present

## 2018-08-16 DIAGNOSIS — M1991 Primary osteoarthritis, unspecified site: Secondary | ICD-10-CM | POA: Diagnosis not present

## 2018-08-16 DIAGNOSIS — K649 Unspecified hemorrhoids: Secondary | ICD-10-CM | POA: Diagnosis not present

## 2018-09-11 DIAGNOSIS — M1991 Primary osteoarthritis, unspecified site: Secondary | ICD-10-CM | POA: Diagnosis not present

## 2018-09-11 DIAGNOSIS — G894 Chronic pain syndrome: Secondary | ICD-10-CM | POA: Diagnosis not present

## 2018-10-10 DIAGNOSIS — G894 Chronic pain syndrome: Secondary | ICD-10-CM | POA: Diagnosis not present

## 2018-11-08 DIAGNOSIS — K601 Chronic anal fissure: Secondary | ICD-10-CM | POA: Diagnosis not present

## 2018-11-08 DIAGNOSIS — E669 Obesity, unspecified: Secondary | ICD-10-CM | POA: Diagnosis not present

## 2018-11-08 DIAGNOSIS — G894 Chronic pain syndrome: Secondary | ICD-10-CM | POA: Diagnosis not present

## 2018-12-06 DIAGNOSIS — M2550 Pain in unspecified joint: Secondary | ICD-10-CM | POA: Diagnosis not present

## 2018-12-06 DIAGNOSIS — G894 Chronic pain syndrome: Secondary | ICD-10-CM | POA: Diagnosis not present

## 2018-12-06 DIAGNOSIS — M1991 Primary osteoarthritis, unspecified site: Secondary | ICD-10-CM | POA: Diagnosis not present

## 2019-01-03 DIAGNOSIS — G894 Chronic pain syndrome: Secondary | ICD-10-CM | POA: Diagnosis not present

## 2019-01-30 DIAGNOSIS — G894 Chronic pain syndrome: Secondary | ICD-10-CM | POA: Diagnosis not present

## 2019-02-27 DIAGNOSIS — G894 Chronic pain syndrome: Secondary | ICD-10-CM | POA: Diagnosis not present

## 2019-11-15 ENCOUNTER — Encounter: Payer: Self-pay | Admitting: Internal Medicine

## 2020-01-04 ENCOUNTER — Encounter: Payer: Self-pay | Admitting: Gastroenterology

## 2020-01-04 NOTE — Progress Notes (Deleted)
Referring Provider: Redmond School, MD Primary Care Physician:  Redmond School, MD Primary Gastroenterologist:  Dr. Abbey Chatters  No chief complaint on file.   HPI:   Patrick Rollins is a 54 y.o. male presenting today at the request of Redmond School, MD for abdominal pain and difficulty with bowel movements.  Colonoscopy in September 2018 with 3 mm tubular adenoma in the transverse colon, diverticulosis in the rectosigmoid, sigmoid, descending, proximal transverse, and proximal ascending colon, external hemorrhoids.  Repeat colonoscopy due between 2023-2028.  Today:  Past Medical History:  Diagnosis Date  . 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 Chronic kidney disease    Kidney stones  . Depression   . Hyperlipidemia   . Hypertension 2013  . Kidney stones     Past Surgical History:  Procedure Laterality Date  . car [place]  . COLONOSCOPY WITH PROPOFOL N/A 11/22/2016   Procedure: COLONOSCOPY WITH PROPOFOL;  Surgeon: Danie Binder, MD;  Location: [facility];  Service: Endoscopy;  Laterality: N/A;  10:00am - pt refused to move  . EXTRACORPOREAL SHOCK WAVE LITHOTRIPSY  06/15/2011   Procedure: EXTRACORPOREAL SHOCK WAVE LITHOTRIPSY (ESWL);  Surgeon: Marissa Nestle, MD;  Location: [facility];  Service: Urology;  Laterality: Right;  ESWL Right Renal Calculus    Current Outpatient Medications  Medication Sig Dispense Refill  . aspirin EC 81 MG tablet Take 81 mg by mouth daily.    Marland Kitchen atorvastatin (LIPITOR) 20 MG tablet Take 20 mg by mouth daily.    Marland Kitchen gemfibrozil (LOPID) 600 MG tablet Take 600 mg by mouth 2 (two) times daily before a meal.    . lisinopril (PRINIVIL,ZESTRIL) 10 MG tablet Take 10 mg by mouth daily.    . naproxen (NAPROSYN) 500 MG tablet Take 500 mg by mouth 2 (two) times daily with a meal.    . oxyCODONE-acetaminophen (PERCOCET) 7.5-325 MG per tablet Take 1 tablet by mouth every 6 (six) hours as needed for moderate pain.     . Pseudoeph-Doxylamine-DM-APAP (NYQUIL PO) Take 1  capsule by mouth at bedtime as needed (sleep).     No current facility-administered medications for this visit.    Allergies as of 01/06/2020 - Review Complete 11/22/2016  Allergen Reaction Noted  . Penicillins Hives 06/09/2011    Family History  Problem Relation Age of Onset  . Cancer Father        ?lung  . Muscular dystrophy Son        died age 39  . Breast cancer Paternal Aunt   . Breast cancer Other   . Colon cancer Neg Hx     Social History   Socioeconomic History  . Marital status: Married    Spouse name: Not on file  . Number of children: Not on file  . Years of education: Not on file  . Highest education level: Not on file  Occupational History  . Not on file  Tobacco Use  . Smoking status: Current Every Day Smoker    Packs/day: 1.50    Years: 30.00    Pack years: 45.00    Types: Cigarettes  . Smokeless tobacco: Never Used  Substance and Sexual Activity  . Alcohol use: Yes    Comment: few drinks on saturdays  . Drug use: No  . Sexual activity: Yes  Other Topics Concern  . Not on file  Social History Narrative  . Not on file   Social Determinants of Health   Financial Resource Strain:   .  Difficulty of Paying Living Expenses: Not on file  Food Insecurity:   . Worried About Charity fundraiser in the Last Year: Not on file  . Ran Out of Food in the Last Year: Not on file  Transportation Needs:   . Lack of Transportation (Medical): Not on file  . Lack of Transportation (Non-Medical): Not on file  Physical Activity:   . Days of Exercise per Week: Not on file  . Minutes of Exercise per Session: Not on file  Stress:   . Feeling of Stress : Not on file  Social Connections:   . Frequency of Communication with Friends and Family: Not on file  . Frequency of Social Gatherings with Friends and Family: Not on file  . Attends Religious Services: Not on file  . Active Member of Clubs or Organizations: Not on file  . Attends Archivist Meetings:  Not on file  . Marital Status: Not on file  Intimate Partner Violence:   . Fear of Current or Ex-Partner: Not on file  . Emotionally Abused: Not on file  . Physically Abused: Not on file  . Sexually Abused: Not on file    Review of Systems: Gen: Denies any fever, chills, fatigue, weight loss, lack of appetite.  CV: Denies chest pain, heart palpitations, peripheral edema, syncope.  Resp: Denies shortness of breath at rest or with exertion. Denies wheezing or cough.  GI: Denies dysphagia or odynophagia. Denies jaundice, hematemesis, fecal incontinence. GU : Denies urinary burning, urinary frequency, urinary hesitancy MS: Denies joint pain, muscle weakness, cramps, or limitation of movement.  Derm: Denies rash, itching, dry skin Psych: Denies depression, anxiety, memory loss, and confusion Heme: Denies bruising, bleeding, and enlarged lymph nodes.  Physical Exam: There were no vitals taken for this visit. General:   Alert and oriented. Pleasant and cooperative. Well-nourished and well-developed.  Head:  Normocephalic and atraumatic. Eyes:  Without icterus, sclera clear and conjunctiva pink.  Ears:  Normal auditory acuity. Nose:  No deformity, discharge,  or lesions. Mouth:  No deformity or lesions, oral mucosa pink.  Neck:  Supple, without mass or thyromegaly. Lungs:  Clear to auscultation bilaterally. No wheezes, rales, or rhonchi. No distress.  Heart:  S1, S2 present without murmurs appreciated.  Abdomen:  +BS, soft, non-tender and non-distended. No HSM noted. No guarding or rebound. No masses appreciated.  Rectal:  Deferred  Msk:  Symmetrical without gross deformities. Normal posture. Pulses:  Normal pulses noted. Extremities:  Without clubbing or edema. Neurologic:  Alert and  oriented x4;  grossly normal neurologically. Skin:  Intact without significant lesions or rashes. Cervical Nodes:  No significant cervical adenopathy. Psych:  Alert and cooperative. Normal mood and  affect.

## 2020-01-06 ENCOUNTER — Ambulatory Visit: Payer: BLUE CROSS/BLUE SHIELD | Admitting: Gastroenterology

## 2020-02-15 NOTE — Progress Notes (Signed)
Referring Provider: Redmond School, MD Primary Care Physician:  Redmond School, MD Primary Gastroenterologist:  Dr. Abbey Chatters  Chief Complaint  Patient presents with  . Constipation    hard to have BM, will take Linzess 266mcg on weekends and cleans out on Sunday's mornings  . Abdominal Pain    lower abd pain, comes/goes, worse during the week  . Rectal Bleeding    about once a month    HPI:   Patrick Rollins is a 54 y.o. male presenting today at the request of Redmond School, MD for abdominal pain with difficulty with BMs.  Patient was last seen at the time of his screening colonoscopy 11/22/2016 revealing 3 mm tubular adenoma in the transverse colon, diverticulosis in the proximal ascending colon, proximal transverse colon, descending colon, sigmoid colon, rectosigmoid colon, external hemorrhoids.  Recommended repeat colonoscopy in 5-10 years.  Today:  Having pain in his LLQ and constipation. Started over the last year. Taking Linzess 290 mcg either Saturday or Sunday when his pain most significant. On Sunday, he gets cleaned out and his abdominal pain resolves. During the week, he is passing very small, soft balls of stool. States it will fil up a bottle cap.  Linzess was started 6 months ago. Occasionally taking Linzess during the week which will allow him to have a BM, but doesn't want to take it daily as he has diarrhea. Feels like there is something in his lower abdomen that is preventing him from passing his stool. Today he feels great as he had very good BMs yesterday.  Toilet tissue hematochezia about once a month. Rectal bleeding started sometime this year. Weight has been stable over the year.   BC powder for headache. Maybe 10 a year. Taking naproxen daily. Has been on oxycodone for 4-5 years.   Nausea when he is straining to have a BM. No vomiting. No heartburn or dysphagia.  No melena.  Thinks he has had blood work this year. Cant' remember when.    Past Medical  History:  Diagnosis Date  . 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 Chronic kidney disease    Kidney stones  . Depression   . Hyperlipidemia   . Hypertension 2013  . Kidney stones     Past Surgical History:  Procedure Laterality Date  . car [place]  . COLONOSCOPY WITH PROPOFOL N/A 11/22/2016   Procedure: COLONOSCOPY WITH PROPOFOL;  Surgeon: Danie Binder, MD; 3 mm tubular adenoma in the transverse colon, diverticulosis in the rectosigmoid, sigmoid, descending, proximal transverse, and proximal ascending colon, external hemorrhoids.  Repeat colonoscopy due between 2023-2028.  Marland Kitchen EXTRACORPOREAL SHOCK WAVE LITHOTRIPSY  06/15/2011   Procedure: EXTRACORPOREAL SHOCK WAVE LITHOTRIPSY (ESWL);  Surgeon: Marissa Nestle, MD;  Location: [facility];  Service: Urology;  Laterality: Right;  ESWL Right Renal Calculus    Current Outpatient Medications  Medication Sig Dispense Refill  . amLODipine (NORVASC) 5 MG tablet Take 5 mg by mouth daily.    Marland Kitchen aspirin EC 81 MG tablet Take 81 mg by mouth daily.    . cholecalciferol (VITAMIN D3) 25 MCG (1000 UNIT) tablet Take 1,000 Units by mouth daily.    Marland Kitchen linaclotide (LINZESS) 290 MCG CAPS capsule Take 290 mcg by mouth daily before breakfast.    . lisinopril (ZESTRIL) 20 MG tablet Take 20 mg by mouth daily.     . naproxen (NAPROSYN) 500 MG tablet Take 500 mg by mouth 2 (two) times daily with a meal.    .  Oxycodone HCl 10 MG TABS Take 10 mg by mouth in the morning, at noon, in the evening, and at bedtime.     No current facility-administered medications for this visit.    Allergies as of 02/17/2020 - Review Complete 02/17/2020  Allergen Reaction Noted  . Penicillins Hives 06/09/2011    Family History  Problem Relation Age of Onset  . Cancer Father        ?lung  . Muscular dystrophy Son        died age 80  . Breast cancer Paternal Aunt   . Breast cancer Other   . Colon cancer Neg Hx     Social History   Socioeconomic History  . Marital status: Married     Spouse name: Not on file  . Number of children: Not on file  . Years of education: Not on file  . Highest education level: Not on file  Occupational History  . Not on file  Tobacco Use  . Smoking status: Current Every Day Smoker    Packs/day: 1.50    Years: 30.00    Pack years: 45.00    Types: Cigarettes  . Smokeless tobacco: Never Used  Substance and Sexual Activity  . Alcohol use: Yes    Comment: 6-8 shots on Saturday  . Drug use: No  . Sexual activity: Yes  Other Topics Concern  . Not on file  Social History Narrative  . Not on file   Social Determinants of Health   Financial Resource Strain:   . Difficulty of Paying Living Expenses: Not on file  Food Insecurity:   . Worried About Charity fundraiser in the Last Year: Not on file  . Ran Out of Food in the Last Year: Not on file  Transportation Needs:   . Lack of Transportation (Medical): Not on file  . Lack of Transportation (Non-Medical): Not on file  Physical Activity:   . Days of Exercise per Week: Not on file  . Minutes of Exercise per Session: Not on file  Stress:   . Feeling of Stress : Not on file  Social Connections:   . Frequency of Communication with Friends and Family: Not on file  . Frequency of Social Gatherings with Friends and Family: Not on file  . Attends Religious Services: Not on file  . Active Member of Clubs or Organizations: Not on file  . Attends Archivist Meetings: Not on file  . Marital Status: Not on file  Intimate Partner Violence:   . Fear of Current or Ex-Partner: Not on file  . Emotionally Abused: Not on file  . Physically Abused: Not on file  . Sexually Abused: Not on file    Review of Systems: Gen: Denies any fever, chills, cold or flulike symptoms, lightheadedness, dizziness, presyncope, syncope. CV: Denies chest pain or palpitations. Resp: Denies shortness of breath or cough.  GI: See HPI. Heme: See HPI  Physical Exam: BP (!) 144/89   Pulse 72   Temp 97.6 F  (36.4 C)   Ht 5\' 6"  (1.676 m)   Wt 207 lb 6.4 oz (94.1 kg)   BMI 33.48 kg/m  General:   Alert and oriented. Pleasant and cooperative. Well-nourished and well-developed.  Head:  Normocephalic and atraumatic. Eyes:  Without icterus, sclera clear and conjunctiva pink.  Ears:  Normal auditory acuity. Lungs:  Clear to auscultation bilaterally. No wheezes, rales, or rhonchi. No distress.  Heart:  S1, S2 present without murmurs appreciated.  Abdomen:  +BS,  soft, and non-distended.  Minimal tenderness to palpation in the very low left lower quadrant/suprapubic area. No HSM noted. No guarding or rebound.  Rectal:  Deferred  Msk:  Symmetrical without gross deformities. Normal posture. Extremities:  Without edema. Neurologic:  Alert and  oriented x4;  grossly normal neurologically. Skin:  Intact without significant lesions or rashes. Psych:  Normal mood and affect.

## 2020-02-17 ENCOUNTER — Other Ambulatory Visit: Payer: Self-pay

## 2020-02-17 ENCOUNTER — Ambulatory Visit (INDEPENDENT_AMBULATORY_CARE_PROVIDER_SITE_OTHER): Payer: 59 | Admitting: Gastroenterology

## 2020-02-17 ENCOUNTER — Encounter: Payer: Self-pay | Admitting: Gastroenterology

## 2020-02-17 DIAGNOSIS — K625 Hemorrhage of anus and rectum: Secondary | ICD-10-CM | POA: Insufficient documentation

## 2020-02-17 DIAGNOSIS — R194 Change in bowel habit: Secondary | ICD-10-CM

## 2020-02-17 NOTE — Assessment & Plan Note (Addendum)
54 year old male who is experience change in bowel habits with constipation over the last year, lower abdominal pain with sensation of something blocking him from having BMs, and new onset intermittent toilet tissue hematochezia.  Abdominal pain improves significantly with good bowel movements.  Currently taking Linzess 290 mcg daily on the weekend which "cleans him out". Doesn't want to take daily. No recent medication changes.  He is chronically on oxycodone, but has been having BMs routinely with this medication historically. Last colonoscopy in 2018 with one 3 mm tubular adenoma, diverticulosis, and external hemorrhoids.  He denies any rectal pain or unintentional weight loss.  No family history of colon cancer. Abdominal exam with minimal tenderness palpation in the very low left lower quadrant/suprapubic area.   Suspect constipation is likely influenced by chronic oxycodone use and hematochezia likely secondary to hemorrhoids in the setting of constipation.  However, as change in bowel habits and rectal bleeding are new within the last year, will proceed with colonoscopy to rule out underlying etiology including large colon polyps or malignancy.  Plan: Stop Linzess 290 mcg on the weekend and trial Linzess 145 mcg daily.  Samples provided. Requested 1-2-week progress report. Proceed with colonoscopy with propofol with Dr. Abbey Chatters in the near future. The risks, benefits, and alternatives have been discussed with the patient in detail. The patient states understanding and desires to proceed.  ASA II Request recent blood work from PCP.  Follow-up after procedure.

## 2020-02-17 NOTE — Assessment & Plan Note (Signed)
Addressed under change in bowel habits.

## 2020-02-17 NOTE — Patient Instructions (Addendum)
Start Linzess 145 mcg daily 30 minutes before breakfast.  Please call in 1-2 weeks with a progress report.  If Linzess 145 mcg is working well, we will send a prescription to your pharmacy.  We will get you scheduled for colonoscopy with Dr. Abbey Chatters in the near future to evaluate your change in bowel habits and rectal bleeding. * It is very important your bowels are moving well prior to this procedure to ensure that you have a good colon prep.  Try to limit all NSAID products including ibuprofen, Aleve, Advil, Goody powders, BC powders, naproxen.  We will plan to see back after your procedure.  Do not hesitate to call with questions or concerns prior.  Aliene Altes, PA-C [facility]

## 2020-03-17 ENCOUNTER — Other Ambulatory Visit: Payer: Self-pay

## 2020-03-17 ENCOUNTER — Other Ambulatory Visit (HOSPITAL_COMMUNITY)
Admission: RE | Admit: 2020-03-17 | Discharge: 2020-03-17 | Disposition: A | Discharge status: Home / Self Care | Payer: 59 | Source: Ambulatory Visit | Attending: Internal Medicine | Admitting: Internal Medicine

## 2020-03-17 DIAGNOSIS — Z01812 Encounter for preprocedural laboratory examination: Secondary | ICD-10-CM | POA: Insufficient documentation

## 2020-03-17 DIAGNOSIS — Z20822 Contact with and (suspected) exposure to covid-19: Secondary | ICD-10-CM | POA: Diagnosis not present

## 2020-03-17 LAB — SARS CORONAVIRUS 2 (TAT 6-24 HRS): SARS Coronavirus 2: NEGATIVE

## 2020-03-19 ENCOUNTER — Ambulatory Visit (HOSPITAL_COMMUNITY)
Admission: RE | Admit: 2020-03-19 | Discharge: 2020-03-19 | Disposition: A | Discharge status: Home / Self Care | Payer: 59 | Attending: Internal Medicine | Admitting: Internal Medicine

## 2020-03-19 ENCOUNTER — Encounter (HOSPITAL_COMMUNITY)
Admission: RE | Disposition: A | Discharge status: Home / Self Care | Payer: Self-pay | Source: Home / Self Care | Attending: Internal Medicine

## 2020-03-19 ENCOUNTER — Encounter (HOSPITAL_COMMUNITY): Payer: Self-pay

## 2020-03-19 ENCOUNTER — Ambulatory Visit (HOSPITAL_COMMUNITY): Payer: 59 | Admitting: Anesthesiology

## 2020-03-19 ENCOUNTER — Other Ambulatory Visit: Payer: Self-pay

## 2020-03-19 DIAGNOSIS — F17210 Nicotine dependence, cigarettes, uncomplicated: Secondary | ICD-10-CM | POA: Insufficient documentation

## 2020-03-19 DIAGNOSIS — K648 Other hemorrhoids: Secondary | ICD-10-CM | POA: Insufficient documentation

## 2020-03-19 DIAGNOSIS — R1032 Left lower quadrant pain: Secondary | ICD-10-CM | POA: Insufficient documentation

## 2020-03-19 DIAGNOSIS — K5900 Constipation, unspecified: Secondary | ICD-10-CM | POA: Insufficient documentation

## 2020-03-19 DIAGNOSIS — Z791 Long term (current) use of non-steroidal anti-inflammatories (NSAID): Secondary | ICD-10-CM | POA: Diagnosis not present

## 2020-03-19 DIAGNOSIS — Z7982 Long term (current) use of aspirin: Secondary | ICD-10-CM | POA: Insufficient documentation

## 2020-03-19 DIAGNOSIS — Z79899 Other long term (current) drug therapy: Secondary | ICD-10-CM | POA: Diagnosis not present

## 2020-03-19 DIAGNOSIS — K5730 Diverticulosis of large intestine without perforation or abscess without bleeding: Secondary | ICD-10-CM | POA: Diagnosis not present

## 2020-03-19 HISTORY — PX: COLONOSCOPY WITH PROPOFOL: SHX5780

## 2020-03-19 SURGERY — COLONOSCOPY WITH PROPOFOL
Anesthesia: General

## 2020-03-19 MED ORDER — LIDOCAINE HCL (CARDIAC) PF 100 MG/5ML IV SOSY
PREFILLED_SYRINGE | INTRAVENOUS | Status: DC | PRN
  Administered 2020-03-19: 40 mg via INTRAVENOUS

## 2020-03-19 MED ORDER — PROPOFOL 10 MG/ML IV BOLUS
INTRAVENOUS | Status: DC | PRN
  Administered 2020-03-19: 100 mg via INTRAVENOUS
  Administered 2020-03-19: 50 mg via INTRAVENOUS
  Administered 2020-03-19: 150 ug/kg/min via INTRAVENOUS

## 2020-03-19 MED ORDER — LACTATED RINGERS IV SOLN
INTRAVENOUS | Status: DC
  Administered 2020-03-19: 1000 mL via INTRAVENOUS

## 2020-03-19 NOTE — Anesthesia Preprocedure Evaluation (Signed)
Anesthesia Evaluation  Patient identified by MRN, date of birth, ID band Patient awake    Reviewed: Allergy & Precautions, H&P , NPO status , Patient's Chart, lab work & pertinent test results, reviewed documented beta blocker date and time   Airway Mallampati: II  TM Distance: >3 FB Neck ROM: full    Dental no notable dental hx.    Pulmonary neg pulmonary ROS, Current Smoker,    Pulmonary exam normal breath sounds clear to auscultation       Cardiovascular Exercise Tolerance: Good hypertension, negative cardio ROS   Rhythm:regular Rate:Normal     Neuro/Psych PSYCHIATRIC DISORDERS Depression negative neurological ROS     GI/Hepatic negative GI ROS, Neg liver ROS,   Endo/Other  negative endocrine ROS  Renal/GU Renal disease  negative genitourinary   Musculoskeletal   Abdominal   Peds  Hematology negative hematology ROS (+)   Anesthesia Other Findings   Reproductive/Obstetrics negative OB ROS                             Anesthesia Physical Anesthesia Plan  ASA: III  Anesthesia Plan: General   Post-op Pain Management:    Induction:   PONV Risk Score and Plan: Propofol infusion  Airway Management Planned:   Additional Equipment:   Intra-op Plan:   Post-operative Plan:   Informed Consent: I have reviewed the patients History and Physical, chart, labs and discussed the procedure including the risks, benefits and alternatives for the proposed anesthesia with the patient or authorized representative who has indicated his/her understanding and acceptance.     Dental Advisory Given  Plan Discussed with: CRNA  Anesthesia Plan Comments:         Anesthesia Quick Evaluation

## 2020-03-19 NOTE — Anesthesia Postprocedure Evaluation (Signed)
Anesthesia Post Note  Patient: Patrick Rollins  Procedure(s) Performed: COLONOSCOPY WITH PROPOFOL (N/A )  Patient location during evaluation: Endoscopy Anesthesia Type: General Level of consciousness: awake, oriented, awake and alert and patient cooperative Pain management: pain level controlled Vital Signs Assessment: post-procedure vital signs reviewed and stable Respiratory status: spontaneous breathing, respiratory function stable and nonlabored ventilation Cardiovascular status: blood pressure returned to baseline and stable Postop Assessment: no headache and no backache Anesthetic complications: no   No complications documented.   Last Vitals:  Vitals:   03/19/20 0902  BP: 122/73  Pulse: 67  Resp: 10  Temp: 36.6 C  SpO2: 97%    Last Pain:  Vitals:   03/19/20 0939  TempSrc:   PainSc: 0-No pain                 Brynda Peon

## 2020-03-19 NOTE — Discharge Instructions (Signed)
°  Colonoscopy Discharge Instructions  Read the instructions outlined below and refer to this sheet in the next few weeks. These discharge instructions provide you with general information on caring for yourself after you leave the hospital. Your doctor may also give you specific instructions. While your treatment has been planned according to the most current medical practices available, unavoidable complications occasionally occur.   ACTIVITY  You may resume your regular activity, but move at a slower pace for the next 24 hours.   Take frequent rest periods for the next 24 hours.   Walking will help get rid of the air and reduce the bloated feeling in your belly (abdomen).   No driving for 24 hours (because of the medicine (anesthesia) used during the test).    Do not sign any important legal documents or operate any machinery for 24 hours (because of the anesthesia used during the test).  NUTRITION  Drink plenty of fluids.   You may resume your normal diet as instructed by your doctor.   Begin with a light meal and progress to your normal diet. Heavy or fried foods are harder to digest and may make you feel sick to your stomach (nauseated).   Avoid alcoholic beverages for 24 hours or as instructed.  MEDICATIONS  You may resume your normal medications unless your doctor tells you otherwise.  WHAT YOU CAN EXPECT TODAY  Some feelings of bloating in the abdomen.   Passage of more gas than usual.   Spotting of blood in your stool or on the toilet paper.  IF YOU HAD POLYPS REMOVED DURING THE COLONOSCOPY:  No aspirin products for 7 days or as instructed.   No alcohol for 7 days or as instructed.   Eat a soft diet for the next 24 hours.  FINDING OUT THE RESULTS OF YOUR TEST Not all test results are available during your visit. If your test results are not back during the visit, make an appointment with your caregiver to find out the results. Do not assume everything is normal if  you have not heard from your caregiver or the medical facility. It is important for you to follow up on all of your test results.  SEEK IMMEDIATE MEDICAL ATTENTION IF:  You have more than a spotting of blood in your stool.   Your belly is swollen (abdominal distention).   You are nauseated or vomiting.   You have a temperature over 101.   You have abdominal pain or discomfort that is severe or gets worse throughout the day.   Your colonoscopy was relatively unremarkable.  I did not find a polyps or evidence of colon cancer. You do have extensive diverticulosis and internal hemorrhoids. I would recommend increasing fiber in your diet or adding OTC Benefiber/Metamucil. Be sure to drink at least 4 to 6 glasses of water daily.  Continue on Linzess.  Repeat colonoscopy in 5 years.  Follow-up with GI in 2 to 3 months.   I hope you have a great rest of your week!  Patrick Rollins. Marletta Lor, D.O. Gastroenterology and Hepatology [facility]

## 2020-03-19 NOTE — H&P (Signed)
Primary Care Physician:  Redmond School, MD Primary Gastroenterologist:  Dr. Abbey Chatters  Pre-Procedure History & Physical: HPI:  Patrick Rollins is a 55 y.o. male is here for a colonoscopy to be performed for rectal bleeding, change in bowel habits. Patient denies any family history of colorectal cancer.  No melena.  No abdominal pain or unintentional weight loss.    Past Medical History:  Diagnosis Date  . 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 Chronic kidney disease    Kidney stones  . Depression   . Hyperlipidemia   . Hypertension 2013  . Kidney stones     Past Surgical History:  Procedure Laterality Date  . car [place]  . COLONOSCOPY WITH PROPOFOL N/A 11/22/2016   Procedure: COLONOSCOPY WITH PROPOFOL;  Surgeon: Danie Binder, MD; 3 mm tubular adenoma in the transverse colon, diverticulosis in the rectosigmoid, sigmoid, descending, proximal transverse, and proximal ascending colon, external hemorrhoids.  Repeat colonoscopy due between 2023-2028.  Marland Kitchen EXTRACORPOREAL SHOCK WAVE LITHOTRIPSY  06/15/2011   Procedure: EXTRACORPOREAL SHOCK WAVE LITHOTRIPSY (ESWL);  Surgeon: Marissa Nestle, MD;  Location: [facility];  Service: Urology;  Laterality: Right;  ESWL Right Renal Calculus    Prior to Admission medications   Medication Sig Start Date End Date Taking? Authorizing Provider  amLODipine (NORVASC) 5 MG tablet Take 5 mg by mouth daily.   Yes [provider]  aspirin EC 81 MG tablet Take 81 mg by mouth daily.   Yes [provider]  Cholecalciferol (VITAMIN D) 50 MCG (2000 UT) CAPS Take 2,000 Units by mouth daily.   Yes [provider]  linaclotide (LINZESS) 290 MCG CAPS capsule Take 290 mcg by mouth daily as needed (Constipation).   Yes [provider]  lisinopril (ZESTRIL) 20 MG tablet Take 20 mg by mouth daily.    Yes [provider]  naproxen (NAPROSYN) 500 MG tablet Take 500 mg by mouth 2 (two) times daily with a meal.   Yes [provider]   Oxycodone HCl 10 MG TABS Take 10 mg by mouth in the morning, at noon, in the evening, and at bedtime.   Yes [provider]    Allergies as of 02/17/2020 - Review Complete 02/17/2020  Allergen Reaction Noted  . Penicillins Hives 06/09/2011    Family History  Problem Relation Age of Onset  . Cancer Father        ?lung  . Muscular dystrophy Son        died age 108  . Breast cancer Paternal Aunt   . Breast cancer Other   . Colon cancer Neg Hx     Social History   Socioeconomic History  . Marital status: Married    Spouse name: Not on file  . Number of children: Not on file  . Years of education: Not on file  . Highest education level: Not on file  Occupational History  . Not on file  Tobacco Use  . Smoking status: Current Every Day Smoker    Packs/day: 1.50    Years: 30.00    Pack years: 45.00    Types: Cigarettes  . Smokeless tobacco: Never Used  Substance and Sexual Activity  . Alcohol use: Yes    Comment: 6-8 shots on Saturday  . Drug use: No  . Sexual activity: Yes  Other Topics Concern  . Not on file  Social History Narrative  . Not on file   Social Determinants of Health   Financial Resource Strain:  Not on file  Food Insecurity: Not on file  Transportation Needs: Not on file  Physical Activity: Not on file  Stress: Not on file  Social Connections: Not on file  Intimate Partner Violence: Not on file    Review of Systems: See HPI, otherwise negative ROS  Impression/Plan: Patrick Rollins is here for a colonoscopy to be performed for rectal bleeding, change in bowel habits.  The risks of the procedure including infection, bleed, or perforation as well as benefits, limitations, alternatives and imponderables have been reviewed with the patient. Questions have been answered. All parties agreeable.

## 2020-03-19 NOTE — Op Note (Signed)
[facility] Patient Name: Patrick Rollins Procedure Date: 03/19/2020 9:26 AM MRN: [id number] Date of Birth: 1965-08-30 Attending MD: Elon Alas. Abbey Chatters DO CSN: [id number] Age: 55 Admit Type: Outpatient Procedure:                Colonoscopy Indications:              Abdominal pain in the left lower quadrant,                            Constipation Providers:                Elon Alas. Brownie Gockel, DO, [place] B. Sharon Seller, RN,                            Caprice Kluver, Nelma Rothman, Technician Referring MD:              Medicines:                See the Anesthesia note for documentation of the                            administered medications Complications:            No immediate complications. Estimated Blood Loss:     Estimated blood loss: none. Procedure:                Pre-Anesthesia Assessment:                           - The anesthesia plan was to use monitored                            anesthesia care (MAC).                           After obtaining informed consent, the colonoscope                            was passed under direct vision. Throughout the                            procedure, the patient's blood pressure, pulse, and                            oxygen saturations were monitored continuously. The                            [id number]-[id number] ([id number]) scope was introduced through                            the anus and advanced to the the cecum, identified                            by appendiceal orifice and ileocecal valve. The                            colonoscopy was performed without difficulty. The  patient tolerated the procedure well. The quality                            of the bowel preparation was evaluated using the                            BBPS [facility] Bowel Preparation Scale) with scores                            of: Right Colon = 2 (minor amount of residual                            staining, small fragments of stool and/or opaque                             liquid, but mucosa seen well), Transverse Colon = 2                            (minor amount of residual staining, small fragments                            of stool and/or opaque liquid, but mucosa seen                            well) and Left Colon = 2 (minor amount of residual                            staining, small fragments of stool and/or opaque                            liquid, but mucosa seen well). The total BBPS score                            equals 6. The quality of the bowel preparation was                            fair. Scope In: 9:40:17 AM Scope Out: 9:52:01 AM Scope Withdrawal Time: 0 hours 8 minutes 52 seconds  Total Procedure Duration: 0 hours 11 minutes 44 seconds  Findings:      The perianal and digital rectal examinations were normal.      Non-bleeding internal hemorrhoids were found during endoscopy.      Multiple small-mouthed diverticula were found in the sigmoid colon and       descending colon.      A moderate amount of stool was found in the entire colon, making       visualization difficult. Lavage of the area was performed using copious       amounts of sterile water, resulting in clearance with fair visualization. Impression:               - Preparation of the colon was fair.                           - Non-bleeding internal  hemorrhoids.                           - Diverticulosis in the sigmoid colon and in the                            descending colon.                           - Stool in the entire examined colon.                           - No specimens collected. Moderate Sedation:      Per Anesthesia Care Recommendation:           - Patient has a contact number available for                            emergencies. The signs and symptoms of potential                            delayed complications were discussed with the                            patient. Return to normal activities tomorrow.                             Written discharge instructions were provided to the                            patient.                           - Resume previous diet.                           - Continue present medications.                           - Await pathology results.                           - Repeat colonoscopy in 5 years for surveillance                            and borderline colon prep.                           - Return to GI clinic in 3 months.                           - Use fiber, for example Citrucel, Fibercon, Konsyl                            or Metamucil. Ensure you are drinking at least 4-6  glasses of water daily. Procedure Code(s):        --- Professional ---                           [phone], Colonoscopy, flexible; diagnostic, including                            collection of specimen(s) by brushing or washing,                            when performed (separate procedure) Diagnosis Code(s):        --- Professional ---                           K64.8, Other hemorrhoids                           R10.32, Left lower quadrant pain                           K59.00, Constipation, unspecified                           K57.30, Diverticulosis of large intestine without                            perforation or abscess without bleeding CPT copyright 2019 American Medical Association. All rights reserved. The codes documented in this report are preliminary and upon coder review may  be revised to meet current compliance requirements. Elon Alas. Abbey Chatters, DO [place] Abbey Chatters, DO 03/19/2020 9:57:46 AM This report has been signed electronically. Number of Addenda: 0

## 2020-03-19 NOTE — Transfer of Care (Signed)
Immediate Anesthesia Transfer of Care Note  Patient: Patrick Rollins  Procedure(s) Performed: COLONOSCOPY WITH PROPOFOL (N/A )  Patient Location: PACU  Anesthesia Type:General  Level of Consciousness: awake, alert , oriented and patient cooperative  Airway & Oxygen Therapy: Patient Spontanous Breathing  Post-op Assessment: Report given to RN, Post -op Vital signs reviewed and stable and Patient moving all extremities  Post vital signs: Reviewed and stable  Last Vitals:  Vitals Value Taken Time  BP    Temp    Pulse    Resp    SpO2      Last Pain:  Vitals:   03/19/20 0939  TempSrc:   PainSc: 0-No pain      Patients Stated Pain Goal: 5 (03/19/20 0902)  Complications: No complications documented.

## 2020-03-23 ENCOUNTER — Encounter (HOSPITAL_COMMUNITY): Payer: Self-pay | Admitting: Internal Medicine

## 2020-06-15 NOTE — Progress Notes (Signed)
Referring Provider: Redmond School, MD Primary Care Physician:  Redmond School, MD Primary GI Physician: Dr. Abbey Chatters  Chief Complaint  Patient presents with  . Constipation    Takes Linzess prn. Unable to take Linzess daily.    HPI:   Patrick Rollins is a 55 y.o. male presenting today for follow-up of change in bowel habits, lower abdominal pain, and rectal bleeding s/p colonoscopy.   Patient was last seen in our office 02/17/2020.  He reported 1 year history of constipation with associated LLQ abdominal pain taking Linzess 290 mcg Saturday or Sunday when pain was most significant.  After Linzess, he cleans out and abdominal pain improves.  Also with toilet tissue hematochezia.once a month over the last year.  Platelets stable.  No significant upper GI symptoms.  Abdominal exam with minimal TTP in LLQ/suprapubic area.  Suspected constipation was influenced by chronic oxycodone and hematochezia likely secondary to hemorrhoids; however, recommended colonoscopy for further evaluation.  Would also try Linzess 145 mcg daily and samples were provided.  Colonoscopy 03/19/2020: Nonbleeding internal hemorrhoids, multiple small mouth diverticula in the sigmoid colon and descending colon, moderate amount of stool in the entire colon making visualization difficult.  Lavage of the area was performed resulting in clearance with fair visualization.  Recommended repeating colonoscopy in 5 years.  Today:  Can't take Linzess every day as it causes diarrhea. Typically taking Linzess on the weekends. Passes little balls of stool initially then will transition to loose stools. The couple days following he will have normal BMs then he starts backing up and has mid lower abdominal pain. He still has BMs every day, but they do not pass well after he hasn't taken Linzess for a few days. He has lower abdominal abdominal pain when he gets backed up and it resolves with Linzess on the weekends. No blood in the stool or  black stools. No nausea or vomiting. No rectal pain.   Trying to slack off on eating as he thought this would help with constipation and abdominal pain.   No upper GI trouble.   Past Medical History:  Diagnosis Date  . 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 Chronic kidney disease    Kidney stones  . Depression   . Hyperlipidemia   . Hypertension 2013  . Kidney stones     Past Surgical History:  Procedure Laterality Date  . car [place]  . COLONOSCOPY WITH PROPOFOL N/A 11/22/2016   Procedure: COLONOSCOPY WITH PROPOFOL;  Surgeon: Danie Binder, MD; 3 mm tubular adenoma in the transverse colon, diverticulosis in the rectosigmoid, sigmoid, descending, proximal transverse, and proximal ascending colon, external hemorrhoids.  Repeat colonoscopy due between 2023-2028.  Marland Kitchen COLONOSCOPY WITH PROPOFOL N/A 03/19/2020    Surgeon: Eloise Harman, DO; nonbleeding internal hemorrhoids, multiple small mouth diverticula in the sigmoid and descending colon.  Poor prep, but lavage resulted in clearance with fair visualization.  Recommended repeat colonoscopy in 5 years.  Marland Kitchen EXTRACORPOREAL SHOCK WAVE LITHOTRIPSY  06/15/2011   Procedure: EXTRACORPOREAL SHOCK WAVE LITHOTRIPSY (ESWL);  Surgeon: Marissa Nestle, MD;  Location: [facility];  Service: Urology;  Laterality: Right;  ESWL Right Renal Calculus    Current Outpatient Medications  Medication Sig Dispense Refill  . amLODipine (NORVASC) 5 MG tablet Take 5 mg by mouth daily.    Marland Kitchen aspirin EC 81 MG tablet Take 81 mg by mouth daily.    . Cholecalciferol (VITAMIN D) 50 MCG (2000 UT) CAPS Take 2,000 Units by  mouth daily.    Marland Kitchen linaclotide (LINZESS) 290 MCG CAPS capsule Take 290 mcg by mouth daily as needed (Constipation).    Marland Kitchen lisinopril (ZESTRIL) 20 MG tablet Take 20 mg by mouth daily.     . naproxen (NAPROSYN) 500 MG tablet Take 500 mg by mouth as needed.    . Oxycodone HCl 10 MG TABS Take 10 mg by mouth in the morning, at noon, in the evening, and at bedtime.     No  current facility-administered medications for this visit.    Allergies as of 06/17/2020 - Review Complete 06/17/2020  Allergen Reaction Noted  . Penicillins Hives 06/09/2011    Family History  Problem Relation Age of Onset  . Cancer Father        ?lung  . Muscular dystrophy Son        died age 77  . Breast cancer Paternal Aunt   . Breast cancer Other   . Colon cancer Neg Hx     Social History   Socioeconomic History  . Marital status: Married    Spouse name: Not on file  . Number of children: Not on file  . Years of education: Not on file  . Highest education level: Not on file  Occupational History  . Not on file  Tobacco Use  . Smoking status: Current Every Day Smoker    Packs/day: 1.50    Years: 30.00    Pack years: 45.00    Types: Cigarettes  . Smokeless tobacco: Never Used  Substance and Sexual Activity  . Alcohol use: Yes    Comment: 6-8 shots on Saturday  . Drug use: No  . Sexual activity: Yes  Other Topics Concern  . Not on file  Social History Narrative  . Not on file   Social Determinants of Health   Financial Resource Strain: Not on file  Food Insecurity: Not on file  Transportation Needs: Not on file  Physical Activity: Not on file  Stress: Not on file  Social Connections: Not on file    Review of Systems: Gen: Denies fever, chills, lightheadedness, or dizziness.  CV: Denies chest pain or palpitations Resp: Denies dyspnea or cough.  GI: See HPI Heme: See HPI  Physical Exam: BP 130/87   Pulse 83   Temp (!) 97.5 F (36.4 C) (Temporal)   Ht 5\' 6"  (1.676 m)   Wt 197 lb 3.2 oz (89.4 kg)   BMI 31.83 kg/m  General:   Alert and oriented. No distress noted. Pleasant and cooperative.  Head:  Normocephalic and atraumatic. Heart:  S1, S2 present without murmurs appreciated. Lungs:  Clear to auscultation bilaterally. No wheezes, rales, or rhonchi. No distress.  Abdomen:  +BS, soft, and non-distended. Mild TTP in the LLQ and suprapubic area.  No rebound or guarding. No HSM or masses noted. Msk:  Symmetrical without gross deformities. Normal posture. Extremities:  Without edema. Neurologic:  Alert and  oriented x4 Psych: Normal mood and affect.   Assessment:  55 y.o. male presenting for follow-up of constipation with associated LLQ and suprapubic abdominal pain that improves with Linzess 290 mcg. Unfortunately he can't take Linzess daily due to diarrhea. Previously tried Linzess 145 mcg daily but this caused diarrhea as well. Suspect we need to try something a little more gentle. Reports MiraLAX hasn't helped in the past. Denies brbpr or melena.  Recent colonoscopy 03/19/2020 with nonbleeding internal hemorrhoids, multiple small mouth diverticula in sigmoid and descending colon.  He had poor prep, but lavage  resulted in clearance with fair visualization.  Recommended repeat colonoscopy in 5 years.   Plan:  1.  Start Metamucil daily. 2.  Start Colace 200 mg every night. 3.  Increase water intake. 4.  Eat plenty of fruits and vegetables daily. 5.  Continue to use Linzess 290 mcg as needed. 5.  Requested progress report in 4 weeks.  If Metamucil and Colace are not working well, we can try something different. 6.  Follow-up in 3 months.    Aliene Altes, PA-C [facility] 06/17/2020

## 2020-06-17 ENCOUNTER — Ambulatory Visit (INDEPENDENT_AMBULATORY_CARE_PROVIDER_SITE_OTHER): Payer: 59 | Admitting: Gastroenterology

## 2020-06-17 ENCOUNTER — Encounter: Payer: Self-pay | Admitting: Gastroenterology

## 2020-06-17 ENCOUNTER — Other Ambulatory Visit: Payer: Self-pay

## 2020-06-17 VITALS — BP 130/87 | HR 83 | Temp 97.5°F | Ht 66.0 in | Wt 197.2 lb

## 2020-06-17 DIAGNOSIS — R1030 Lower abdominal pain, unspecified: Secondary | ICD-10-CM | POA: Diagnosis not present

## 2020-06-17 DIAGNOSIS — K5900 Constipation, unspecified: Secondary | ICD-10-CM

## 2020-06-17 NOTE — Patient Instructions (Addendum)
To help with constipation and stool evacuation:  Start Metamucil daily.  You may follow the instructions on the container. Start Colace 2, 100 mg capsules (200 mg total) every night. You may continue to use Linzess 290 mcg as needed. Increase your water intake.  Eat plenty of fruits and vegetables daily.   Please call me in 4 weeks with a progress report on how Metamucil and Colace are doing.  If they are not working well, we can try something different.  We will plan to follow-up in 3 months.  It was good to see you today!  Aliene Altes, PA-C [facility]   Constipation, Adult Constipation is when a person has trouble pooping (having a bowel movement). When you have this condition, you may poop fewer than 3 times a week. Your poop (stool) may also be dry, hard, or bigger than normal. Follow these instructions at home: Eating and drinking  Eat foods that have a lot of fiber, such as: ? Fresh fruits and vegetables. ? Whole grains. ? Beans.  Eat less of foods that are low in fiber and high in fat and sugar, such as: ? [place] fries. ? Hamburgers. ? Cookies. ? Candy. ? Soda.  Drink enough fluid to keep your pee (urine) pale yellow.   General instructions  Exercise regularly or as told by your doctor. Try to do 150 minutes of exercise each week.  Go to the restroom when you feel like you need to poop. Do not hold it in.  Take over-the-counter and prescription medicines only as told by your doctor. These include any fiber supplements.  When you poop: ? Do deep breathing while relaxing your lower belly (abdomen). ? Relax your pelvic floor. The pelvic floor is a group of muscles that support the rectum, bladder, and intestines (as well as the uterus in women).  Watch your condition for any changes. Tell your doctor if you notice any.  Keep all follow-up visits as told by your doctor. This is important. Contact a doctor if:  You have pain that gets  worse.  You have a fever.  You have not pooped for 4 days.  You vomit.  You are not hungry.  You lose weight.  You are bleeding from the opening of the butt (anus).  You have thin, pencil-like poop. Get help right away if:  You have a fever, and your symptoms suddenly get worse.  You leak poop or have blood in your poop.  Your belly feels hard or bigger than normal (bloated).  You have very bad belly pain.  You feel dizzy or you faint. Summary  Constipation is when a person poops fewer than 3 times a week, has trouble pooping, or has poop that is dry, hard, or bigger than normal.  Eat foods that have a lot of fiber.  Drink enough fluid to keep your pee (urine) pale yellow.  Take over-the-counter and prescription medicines only as told by your doctor. These include any fiber supplements. This information is not intended to replace advice given to you by your health care provider. Make sure you discuss any questions you have with your health care provider. Document Revised: 01/16/2019 Document Reviewed: 01/16/2019 Elsevier Patient Education  [place].
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
## 2020-09-17 ENCOUNTER — Encounter: Payer: Self-pay | Admitting: Internal Medicine

## 2020-09-17 ENCOUNTER — Ambulatory Visit: Payer: 59 | Admitting: Gastroenterology

## 2021-07-21 ENCOUNTER — Ambulatory Visit (HOSPITAL_COMMUNITY): Payer: 59

## 2021-07-21 ENCOUNTER — Encounter (HOSPITAL_COMMUNITY): Payer: Self-pay

## 2021-07-21 ENCOUNTER — Other Ambulatory Visit: Payer: Self-pay

## 2021-07-21 ENCOUNTER — Emergency Department (HOSPITAL_COMMUNITY)
Admission: EM | Admit: 2021-07-21 | Discharge: 2021-07-21 | Disposition: A | Discharge status: Home / Self Care | Payer: 59 | Attending: Emergency Medicine | Admitting: Emergency Medicine

## 2021-07-21 DIAGNOSIS — Z5321 Procedure and treatment not carried out due to patient leaving prior to being seen by health care provider: Secondary | ICD-10-CM | POA: Diagnosis not present

## 2021-07-21 DIAGNOSIS — R109 Unspecified abdominal pain: Secondary | ICD-10-CM | POA: Insufficient documentation

## 2021-07-21 LAB — BASIC METABOLIC PANEL
Anion gap: 10 (ref 5–15)
BUN: 14 mg/dL (ref 6–20)
CO2: 19 mmol/L — ABNORMAL LOW (ref 22–32)
Calcium: 9.2 mg/dL (ref 8.9–10.3)
Chloride: 108 mmol/L (ref 98–111)
Creatinine, Ser: 1.32 mg/dL — ABNORMAL HIGH (ref 0.61–1.24)
GFR, Estimated: >60 mL/min (ref >60)
Glucose, Bld: 190 mg/dL — ABNORMAL HIGH (ref 70–99)
Potassium: 4.3 mmol/L (ref 3.5–5.1)
Sodium: 137 mmol/L (ref 135–145)

## 2021-07-21 LAB — URINALYSIS, ROUTINE W REFLEX MICROSCOPIC
Bacteria, UA: NONE SEEN
Bilirubin Urine: NEGATIVE
Glucose, UA: NEGATIVE mg/dL
Ketones, ur: NEGATIVE mg/dL
Leukocytes,Ua: NEGATIVE
Nitrite: NEGATIVE
Protein, ur: 100 mg/dL — AB
RBC / HPF: >50 RBC/hpf — ABNORMAL HIGH (ref 0–5)
Specific Gravity, Urine: 1.015 (ref 1.005–1.030)
pH: 5 (ref 5.0–8.0)

## 2021-07-21 LAB — CBC
HCT: 46.1 % (ref 39.0–52.0)
Hemoglobin: 15.4 g/dL (ref 13.0–17.0)
MCH: 30.6 pg (ref 26.0–34.0)
MCHC: 33.4 g/dL (ref 30.0–36.0)
MCV: 91.5 fL (ref 80.0–100.0)
Platelets: 171 10*3/uL (ref 150–400)
RBC: 5.04 MIL/uL (ref 4.22–5.81)
RDW: 12.8 % (ref 11.5–15.5)
WBC: 12.9 10*3/uL — ABNORMAL HIGH (ref 4.0–10.5)
nRBC: 0 % (ref 0.0–0.2)

## 2021-07-21 MED ORDER — SODIUM CHLORIDE 0.9 % IV BOLUS: 1000.0000 mL | Freq: Once | INTRAVENOUS | Status: DC

## 2021-07-21 MED ORDER — KETOROLAC TROMETHAMINE 30 MG/ML IJ SOLN: 30.0000 mg | Freq: Once | INTRAMUSCULAR | Status: DC

## 2021-07-21 NOTE — ED Triage Notes (Signed)
R flank pain that started today.  Hx of kidney stones.  Resp even and unlabored.  Skin warm and dry.  nad ?

## 2021-09-27 ENCOUNTER — Encounter: Payer: Self-pay | Admitting: *Deleted

## 2021-10-05 ENCOUNTER — Encounter: Payer: Self-pay | Admitting: Urology

## 2021-10-05 ENCOUNTER — Ambulatory Visit: Payer: 59 | Admitting: Urology

## 2021-10-05 VITALS — BP 130/81 | HR 76 | Ht 66.0 in | Wt 180.0 lb

## 2021-10-05 DIAGNOSIS — N200 Calculus of kidney: Secondary | ICD-10-CM | POA: Diagnosis not present
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
## 2021-10-09 LAB — CALCULI, WITH PHOTOGRAPH (CLINICAL LAB)
Calcium Oxalate Monohydrate: 100 %
Weight Calculi: 217 mg

## 2021-10-12 ENCOUNTER — Other Ambulatory Visit: Payer: Self-pay | Admitting: Urology

## 2021-10-19 LAB — LITHOLINK 24HR URINE PANEL
Ammonium, Urine: 49 mmol/24 hr (ref 15–60)
Calcium Oxalate Saturation: 5.77 — ABNORMAL LOW (ref 6.00–10.00)
Calcium Phosphate Saturation: 0.23 — ABNORMAL LOW (ref 0.50–2.00)
Calcium, Urine: 67 mg/24 hr (ref <250)
Calcium/Creatinine Ratio: 46 mg/g creat (ref 34–196)
Chloride, Urine: 154 mmol/24 hr (ref 70–250)
Citrate, Urine: <27 mg/24 hr — ABNORMAL LOW (ref >450)
Creatinine, Urine: 1476 mg/24 hr
Cystine, Urine, Qualitative: NEGATIVE
Magnesium, Urine: 46 mg/24 hr (ref 30–120)
Oxalate, Urine: 59 mg/24 hr — ABNORMAL HIGH (ref 20–40)
Phosphorus, Urine: 1247 mg/24 hr — ABNORMAL HIGH (ref 600–1200)
Potassium, Urine: 38 mmol/24 hr (ref 20–100)
Sodium, Urine: 142 mmol/24 hr (ref 50–150)
Sulfate, Urine: 7 meq/24 hr — ABNORMAL LOW (ref 20–80)
Urea Nitrogen, Urine: 6.13 g/24 hr (ref 6.00–14.00)
Uric Acid Saturation: 1.24 — ABNORMAL HIGH (ref <1.00)
Uric Acid, Urine: 542 mg/24 hr (ref <800)
Urine Volume (Preserved): 1780 mL/24 hr (ref 500–4000)
pH, 24 hr, Urine: 5.583 — ABNORMAL LOW (ref 5.800–6.200)

## 2021-10-27 ENCOUNTER — Telehealth: Payer: Self-pay

## 2021-10-27 NOTE — Telephone Encounter (Signed)
Patient called and wanted to know when he would get the results back from his 24 hr urine and stone study.

## 2021-10-28 NOTE — Telephone Encounter (Signed)
Patient returned call inquiring on 24 hr urine results and stone analysis. Please advise.

## 2021-10-28 NOTE — Telephone Encounter (Signed)
Returned call with no answer. Message left to return call to office.

## 2021-10-28 NOTE — Telephone Encounter (Signed)
Patient states that the message to schedule CT was accidentally erased on his home number. Number given to patient to contact [facility] to schedule CT.  Patient made aware once you receive CT results you will go over all the findings with the patient at one time. Patient voiced understanding.

## 2021-11-29 ENCOUNTER — Other Ambulatory Visit (HOSPITAL_COMMUNITY): Payer: 59

## 2022-02-17 ENCOUNTER — Encounter: Payer: Self-pay | Admitting: *Deleted

## 2022-03-30 ENCOUNTER — Ambulatory Visit: Payer: 59 | Admitting: Gastroenterology

## 2022-05-05 ENCOUNTER — Emergency Department (HOSPITAL_COMMUNITY)
Admission: EM | Admit: 2022-05-05 | Discharge: 2022-05-05 | Disposition: A | Discharge status: Home / Self Care | Payer: PRIVATE HEALTH INSURANCE | Attending: Emergency Medicine | Admitting: Emergency Medicine

## 2022-05-05 ENCOUNTER — Emergency Department (HOSPITAL_COMMUNITY): Payer: PRIVATE HEALTH INSURANCE

## 2022-05-05 ENCOUNTER — Encounter (HOSPITAL_COMMUNITY): Payer: Self-pay

## 2022-05-05 ENCOUNTER — Other Ambulatory Visit: Payer: Self-pay

## 2022-05-05 DIAGNOSIS — N132 Hydronephrosis with renal and ureteral calculous obstruction: Secondary | ICD-10-CM | POA: Diagnosis not present

## 2022-05-05 DIAGNOSIS — R109 Unspecified abdominal pain: Secondary | ICD-10-CM | POA: Diagnosis present

## 2022-05-05 DIAGNOSIS — N200 Calculus of kidney: Secondary | ICD-10-CM

## 2022-05-05 LAB — CBC WITH DIFFERENTIAL/PLATELET
Abs Immature Granulocytes: 0.07 10*3/uL (ref 0.00–0.07)
Basophils Absolute: 0.1 10*3/uL (ref 0.0–0.1)
Basophils Relative: 0 %
Eosinophils Absolute: 0 10*3/uL (ref 0.0–0.5)
Eosinophils Relative: 0 %
HCT: 46.6 % (ref 39.0–52.0)
Hemoglobin: 16 g/dL (ref 13.0–17.0)
Immature Granulocytes: 1 %
Lymphocytes Relative: 8 %
Lymphs Abs: 1.3 10*3/uL (ref 0.7–4.0)
MCH: 31.3 pg (ref 26.0–34.0)
MCHC: 34.3 g/dL (ref 30.0–36.0)
MCV: 91.2 fL (ref 80.0–100.0)
Monocytes Absolute: 0.8 10*3/uL (ref 0.1–1.0)
Monocytes Relative: 5 %
Neutro Abs: 13.3 10*3/uL — ABNORMAL HIGH (ref 1.7–7.7)
Neutrophils Relative %: 86 %
Platelets: 170 10*3/uL (ref 150–400)
RBC: 5.11 MIL/uL (ref 4.22–5.81)
RDW: 12.6 % (ref 11.5–15.5)
WBC: 15.5 10*3/uL — ABNORMAL HIGH (ref 4.0–10.5)
nRBC: 0 % (ref 0.0–0.2)

## 2022-05-05 LAB — COMPREHENSIVE METABOLIC PANEL
ALT: 39 U/L (ref 0–44)
AST: 32 U/L (ref 15–41)
Albumin: 4.4 g/dL (ref 3.5–5.0)
Alkaline Phosphatase: 75 U/L (ref 38–126)
Anion gap: 14 (ref 5–15)
BUN: 17 mg/dL (ref 6–20)
CO2: 17 mmol/L — ABNORMAL LOW (ref 22–32)
Calcium: 9 mg/dL (ref 8.9–10.3)
Chloride: 103 mmol/L (ref 98–111)
Creatinine, Ser: 1.39 mg/dL — ABNORMAL HIGH (ref 0.61–1.24)
GFR, Estimated: 59 mL/min — ABNORMAL LOW (ref >60)
Glucose, Bld: 277 mg/dL — ABNORMAL HIGH (ref 70–99)
Potassium: 4.1 mmol/L (ref 3.5–5.1)
Sodium: 134 mmol/L — ABNORMAL LOW (ref 135–145)
Total Bilirubin: 0.7 mg/dL (ref 0.3–1.2)
Total Protein: 7.3 g/dL (ref 6.5–8.1)

## 2022-05-05 LAB — LIPASE, BLOOD: Lipase: 17 U/L (ref 11–51)

## 2022-05-05 MED ORDER — ONDANSETRON 4 MG PO TBDP: ORAL_TABLET | ORAL | 0 refills | Status: AC

## 2022-05-05 MED ORDER — TAMSULOSIN HCL 0.4 MG PO CAPS: 0.4000 mg | ORAL_CAPSULE | Freq: Every day | ORAL | 0 refills | Status: AC

## 2022-05-05 MED ORDER — LACTATED RINGERS IV BOLUS
1000.0000 mL | Freq: Once | INTRAVENOUS | Status: AC
  Administered 2022-05-05: 1000 mL via INTRAVENOUS

## 2022-05-05 MED ORDER — KETOROLAC TROMETHAMINE 30 MG/ML IJ SOLN
30.0000 mg | Freq: Once | INTRAMUSCULAR | Status: AC
  Administered 2022-05-05: 30 mg via INTRAVENOUS
  Filled 2022-05-05: qty 1

## 2022-05-05 MED ORDER — HYDROMORPHONE HCL 1 MG/ML IJ SOLN
1.0000 mg | Freq: Once | INTRAMUSCULAR | Status: AC
  Administered 2022-05-05: 1 mg via INTRAVENOUS
  Filled 2022-05-05: qty 1

## 2022-05-05 MED ORDER — TAMSULOSIN HCL 0.4 MG PO CAPS
0.4000 mg | ORAL_CAPSULE | Freq: Once | ORAL | Status: AC
  Administered 2022-05-05: 0.4 mg via ORAL
  Filled 2022-05-05: qty 1

## 2022-05-05 NOTE — ED Notes (Signed)
Unable to provide urine sample

## 2022-05-05 NOTE — ED Provider Notes (Signed)
[facility] Provider Note   CSN: [id number] Arrival date & time: 05/05/22  0105     History  Chief Complaint  Patient presents with   Flank Pain    Patrick Rollins is a 57 y.o. male.  Here with kidney stone.  History of "500 kidney stones in the past".  States this was started yesterday and has progressively worsened.  Has not come out as he would expect.  Pain is on the right.  Has had some emesis with it.  Has a urologist has been worked this up multiple times in the past.  Has had lithotripsy at some point.   Flank Pain       Home Medications Prior to Admission medications   Medication Sig Start Date End Date Taking? Authorizing Provider  ondansetron (ZOFRAN-ODT) 4 MG disintegrating tablet 61m ODT q4 hours prn nausea/vomit 05/05/22  Yes Aunika Kirsten, JCorene Cornea MD  tamsulosin (FLOMAX) 0.4 MG CAPS capsule Take 1 capsule (0.4 mg total) by mouth daily. Until stone passes 05/05/22  Yes Kentavious Michele, JCorene Cornea MD  amLODipine (NORVASC) 5 MG tablet Take 5 mg by mouth daily.    [provider]  lisinopril (ZESTRIL) 20 MG tablet Take 20 mg by mouth daily.     [provider]  Oxycodone HCl 10 MG TABS Take 10 mg by mouth in the morning, at noon, in the evening, and at bedtime.    [provider]      Allergies    Penicillins    Review of Systems   Review of Systems  Genitourinary:  Positive for flank pain.    Physical Exam Updated Vital Signs BP (!) 142/88   Pulse 80   Temp 97.8 F (36.6 C)   Resp (!) 22   Ht 5' 6"$  (1.676 m)   Wt 87 kg   SpO2 99%   BMI 30.96 kg/m  Physical Exam Vitals and nursing note reviewed.  Constitutional:      Appearance: He is well-developed.  HENT:     Head: Normocephalic and atraumatic.  Eyes:     Pupils: Pupils are equal, round, and reactive to light.  Cardiovascular:     Rate and Rhythm: Normal rate.  Pulmonary:     Effort: Pulmonary effort is normal. No respiratory distress.   Abdominal:     General: There is no distension.  Musculoskeletal:        General: Normal range of motion.     Cervical back: Normal range of motion.  Skin:    General: Skin is warm and dry.  Neurological:     General: No focal deficit present.     Mental Status: He is alert.     ED Results / Procedures / Treatments   Labs (all labs ordered are listed, but only abnormal results are displayed) Labs Reviewed  COMPREHENSIVE METABOLIC PANEL - Abnormal; Notable for the following components:      Result Value   Sodium 134 (*)    CO2 17 (*)    Glucose, Bld 277 (*)    Creatinine, Ser 1.39 (*)    GFR, Estimated 59 (*)    All other components within normal limits  CBC WITH DIFFERENTIAL/PLATELET - Abnormal; Notable for the following components:   WBC 15.5 (*)    Neutro Abs 13.3 (*)    All other components within normal limits  LIPASE, BLOOD    EKG None  Radiology CT Renal Stone Study  Result Date: 05/05/2022 CLINICAL DATA:  Abdominal pain.  Left flank pain. EXAM: CT ABDOMEN AND PELVIS WITHOUT CONTRAST TECHNIQUE: Multidetector CT imaging of the abdomen and pelvis was performed following the standard protocol without IV contrast. RADIATION DOSE REDUCTION: This exam was performed according to the departmental dose-optimization program which includes automated exposure control, adjustment of the mA and/or kV according to patient size and/or use of iterative reconstruction technique. COMPARISON:  CT abdomen pelvis dated 08/09/2001. FINDINGS: Evaluation of this exam is limited in the absence of intravenous contrast. Lower chest: The visualized lung bases are clear. There is coronary vascular calcification. No intra-abdominal free air or free fluid. Hepatobiliary: Fatty liver. No biliary dilatation. The gallbladder is unremarkable. Pancreas: There is thickened appearance of the distal pain previous with atrophy of the head and proximal body of the pancreas. Findings most concerning for a  pancreatic neoplasm measuring approximately 4.2 x 1.9 cm. Further evaluation with pancreatic mass protocol MRI on a nonemergent/outpatient basis recommended. Spleen: Normal in size without focal abnormality. Adrenals/Urinary Tract: The adrenal glands are unremarkable. There is an 8 mm calculus at the left ureterovesical junction with mild left hydronephrosis. Multiple additional nonobstructing bilateral renal calculi measure up to 1 cm in the interpolar right kidney. There is no hydronephrosis on the right. There is left perinephric stranding. Correlation with urinalysis recommended to exclude superimposed UTI. The right ureter appears unremarkable. There is herniation of a portion of the urinary bladder into the or right inguinal canal. Stomach/Bowel: There is sigmoid diverticulosis with muscular hypertrophy. No active inflammatory changes. There is no bowel obstruction or active inflammation. The appendix is normal. Vascular/Lymphatic: Moderate aortoiliac atherosclerotic disease. The IVC is unremarkable. No portal venous gas. There is no adenopathy. Reproductive: The prostate and seminal vesicles are grossly unremarkable. No pelvic mass. Other: None Musculoskeletal: No acute or significant osseous findings. IMPRESSION: 1. A 8 mm left UVJ calculus with mild left hydronephrosis. Multiple additional nonobstructing bilateral renal calculi measure up to 1 cm in the interpolar right kidney. 2. Sigmoid diverticulosis. No bowel obstruction. Normal appendix. 3. Fatty liver. 4. Findings most concerning for a pancreatic neoplasm. Further evaluation with pancreatic mass protocol MRI on a nonemergent/outpatient basis recommended. 5. 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 6.  Aortic Atherosclerosis (ICD10-I70.0). Electronically Signed   By: Anner Crete M.D.   On: 05/05/2022 03:03    Procedures Procedures    Medications Ordered in ED Medications  HYDROmorphone (DILAUDID)  injection 1 mg (1 mg Intravenous Given 05/05/22 0229)  lactated ringers bolus 1,000 mL (0 mLs Intravenous Stopped 05/05/22 0436)  ketorolac (TORADOL) 30 MG/ML injection 30 mg (30 mg Intravenous Given 05/05/22 0325)  tamsulosin (FLOMAX) capsule 0.4 mg (0.4 mg Oral Given 05/05/22 0326)    ED Course/ Medical Decision Making/ A&P                             Medical Decision Making Amount and/or Complexity of Data Reviewed Labs: ordered. Radiology: ordered.  Risk Prescription drug management.   My read patient has a large approximately 7 mm kidney stone about the drop in the bladder.  Radiology read the same.  Radiology also notes abnormal finding in his pancreas consistent with likely neoplasm.  Patient's symptoms are improved so we will continue treatments at home.  I discussed at length with him and his wife about the possibility of pancreatic cancer and the need for evaluation by his PCP with likely MRI.  They both  state understanding.  Stable for discharge.  Final Clinical Impression(s) / ED Diagnoses Final diagnoses:  Kidney stone    Rx / DC Orders ED Discharge Orders          Ordered    tamsulosin (FLOMAX) 0.4 MG CAPS capsule  Daily        05/05/22 0401    ondansetron (ZOFRAN-ODT) 4 MG disintegrating tablet        05/05/22 0401              Dominque Marlin, Corene Cornea, MD 05/05/22 [id number]

## 2022-05-05 NOTE — Discharge Instructions (Signed)
CT findings: 1. A 8 mm left UVJ calculus with mild left hydronephrosis. Multiple additional nonobstructing bilateral renal calculi measure up to 1 cm in the interpolar right kidney. 2. Sigmoid diverticulosis. No bowel obstruction. Normal appendix. 3. Fatty liver. 4. Findings most concerning for a pancreatic neoplasm. Further evaluation with pancreatic mass protocol MRI on a nonemergent/outpatient basis recommended. 5. 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 6.  Aortic Atherosclerosis (ICD10-I70.0).

## 2022-05-05 NOTE — ED Triage Notes (Signed)
Pov from home. Cc of left flank pain since 830pm. Hx of kidney stones. Pain is so bad he is nauseated

## 2022-05-19 ENCOUNTER — Encounter: Payer: PRIVATE HEALTH INSURANCE | Admitting: Gastroenterology

## 2022-08-24 DIAGNOSIS — C259 Malignant neoplasm of pancreas, unspecified: Secondary | ICD-10-CM | POA: Insufficient documentation

## 2022-08-25 ENCOUNTER — Inpatient Hospital Stay: Payer: PRIVATE HEALTH INSURANCE | Attending: Hematology | Admitting: Hematology

## 2022-08-25 ENCOUNTER — Telehealth: Payer: Self-pay | Admitting: Hematology

## 2022-08-25 ENCOUNTER — Encounter: Payer: Self-pay | Admitting: Hematology

## 2022-08-25 VITALS — BP 114/88 | HR 87 | Temp 98.2°F | Resp 18 | Ht 66.0 in | Wt 170.6 lb

## 2022-08-25 DIAGNOSIS — R1013 Epigastric pain: Secondary | ICD-10-CM | POA: Insufficient documentation

## 2022-08-25 DIAGNOSIS — Z86010 Personal history of colonic polyps: Secondary | ICD-10-CM | POA: Diagnosis not present

## 2022-08-25 DIAGNOSIS — Z809 Family history of malignant neoplasm, unspecified: Secondary | ICD-10-CM | POA: Diagnosis not present

## 2022-08-25 DIAGNOSIS — C251 Malignant neoplasm of body of pancreas: Secondary | ICD-10-CM | POA: Insufficient documentation

## 2022-08-25 DIAGNOSIS — M1990 Unspecified osteoarthritis, unspecified site: Secondary | ICD-10-CM | POA: Insufficient documentation

## 2022-08-25 DIAGNOSIS — M549 Dorsalgia, unspecified: Secondary | ICD-10-CM | POA: Insufficient documentation

## 2022-08-25 DIAGNOSIS — F17210 Nicotine dependence, cigarettes, uncomplicated: Secondary | ICD-10-CM | POA: Diagnosis not present

## 2022-08-25 DIAGNOSIS — Z79899 Other long term (current) drug therapy: Secondary | ICD-10-CM | POA: Diagnosis not present

## 2022-08-25 DIAGNOSIS — I81 Portal vein thrombosis: Secondary | ICD-10-CM | POA: Insufficient documentation

## 2022-08-25 DIAGNOSIS — Z803 Family history of malignant neoplasm of breast: Secondary | ICD-10-CM | POA: Diagnosis not present

## 2022-08-25 DIAGNOSIS — Z880 Allergy status to penicillin: Secondary | ICD-10-CM | POA: Insufficient documentation

## 2022-08-25 DIAGNOSIS — C259 Malignant neoplasm of pancreas, unspecified: Secondary | ICD-10-CM

## 2022-08-25 DIAGNOSIS — Z87442 Personal history of urinary calculi: Secondary | ICD-10-CM | POA: Diagnosis not present

## 2022-08-25 DIAGNOSIS — N132 Hydronephrosis with renal and ureteral calculous obstruction: Secondary | ICD-10-CM | POA: Diagnosis not present

## 2022-08-25 DIAGNOSIS — Z8269 Family history of other diseases of the musculoskeletal system and connective tissue: Secondary | ICD-10-CM | POA: Insufficient documentation

## 2022-08-25 DIAGNOSIS — R110 Nausea: Secondary | ICD-10-CM | POA: Diagnosis not present

## 2022-08-25 DIAGNOSIS — Z8719 Personal history of other diseases of the digestive system: Secondary | ICD-10-CM | POA: Insufficient documentation

## 2022-08-25 NOTE — Progress Notes (Signed)
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
## 2022-08-25 NOTE — Patient Instructions (Addendum)
[facility]  Discharge Instructions  You were seen and examined today by Dr. Ellin Saba. Dr. Ellin Saba is a medical oncologist, meaning that he specializes in the treatment of cancer diagnoses. Dr. Ellin Saba discussed your past medical history, family history of cancers, and the events that led to you being here today.  You were referred here for ongoing management of your pancreatic cancer.  It is recommended that you have chemotherapy. As Dr. Thana Ates discussed, it would be FOLFIRINOX which is once every 2 weeks with a pump that would administer chemotherapy for 48 hours straight when put on.  Please proceed with your Port-A-Cath placement scheduled for June 18th at [facility]. Please call them for details and specific instructions.  Once you are prepared to have treatment here, please reach out to [place] directly.  Thank you for choosing [facility] to provide your oncology and hematology care.   To afford each patient quality time with our provider, please arrive at least 15 minutes before your scheduled appointment time. You may need to reschedule your appointment if you arrive late (10 or more minutes). Arriving late affects you and other patients whose appointments are after yours.  Also, if you miss three or more appointments without notifying the office, you may be dismissed from the clinic at the provider's discretion.    Again, thank you for choosing [facility].  Our hope is that these requests will decrease the amount of time that you wait before being seen by our physicians.   If you have a lab appointment with the Cancer Center - please note that after April 8th, all labs will be drawn in the cancer center.  You do not have to check in or register with the main entrance as you have in the past but will complete your check-in at the cancer center.             _____________________________________________________________  Should you have questions after your visit to [facility], please contact our office at [phone] and follow the prompts.  Our office hours are 8:00 a.m. to 4:30 p.m. Monday - Thursday and 8:00 a.m. to 2:30 p.m. Friday.  Please note that voicemails left after 4:00 p.m. may not be returned until the following business day.  We are closed weekends and all major holidays.  You do have access to a nurse 24-7, just call the main number to the clinic [phone] and do not press any options, hold on the line and a nurse will answer the phone.    For prescription refill requests, have your pharmacy contact our office and allow 72 hours.    Masks are no longer required in the cancer centers. If you would like for your care team to wear a mask while they are taking care of you, please let them know. You may have one support person who is at least 58 years old accompany you for your appointments.

## 2022-08-25 NOTE — Telephone Encounter (Signed)
Pts has NO OON benefits. The will not even consider a OON exception. on their website now looking to see what his options are. [email or web address]. [place] is the only option I see so far

## 2022-09-07 ENCOUNTER — Encounter: Payer: Self-pay | Admitting: Licensed Clinical Social Worker

## 2022-09-07 DIAGNOSIS — Z1379 Encounter for other screening for genetic and chromosomal anomalies: Secondary | ICD-10-CM | POA: Insufficient documentation

## 2022-09-19 ENCOUNTER — Telehealth: Payer: Self-pay | Admitting: Hematology

## 2022-09-19 NOTE — Telephone Encounter (Signed)
Spoke to Mr Riedinger regarding OON ins denial. He stated that he has been denied for disability but his SSI app is pending some paper work that he is waiting to get in the mail and return back to them. Advised him to call SSI to request another form since he has not received it yet.  I also advised him to go ahead a seek tx at [facility] for the time being, since the SSI process can be long and tedious. I advised him that he would be able to transfer his care with no problem if he is approved for Medicare or changes ins.
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
## 2022-09-29 ENCOUNTER — Inpatient Hospital Stay: Payer: Medicaid Other | Attending: Hematology | Admitting: Hematology

## 2022-09-29 ENCOUNTER — Encounter: Payer: Self-pay | Admitting: Hematology

## 2022-09-29 VITALS — BP 113/73 | HR 93 | Temp 99.1°F | Resp 18 | Wt 167.8 lb

## 2022-09-29 DIAGNOSIS — C786 Secondary malignant neoplasm of retroperitoneum and peritoneum: Secondary | ICD-10-CM | POA: Insufficient documentation

## 2022-09-29 DIAGNOSIS — Z79630 Long term (current) use of alkylating agent: Secondary | ICD-10-CM | POA: Diagnosis not present

## 2022-09-29 DIAGNOSIS — Z79631 Long term (current) use of antimetabolite agent: Secondary | ICD-10-CM | POA: Diagnosis not present

## 2022-09-29 DIAGNOSIS — Z8269 Family history of other diseases of the musculoskeletal system and connective tissue: Secondary | ICD-10-CM | POA: Diagnosis not present

## 2022-09-29 DIAGNOSIS — R5383 Other fatigue: Secondary | ICD-10-CM | POA: Diagnosis not present

## 2022-09-29 DIAGNOSIS — R110 Nausea: Secondary | ICD-10-CM | POA: Insufficient documentation

## 2022-09-29 DIAGNOSIS — Z79634 Long term (current) use of topoisomerase inhibitor: Secondary | ICD-10-CM | POA: Insufficient documentation

## 2022-09-29 DIAGNOSIS — I81 Portal vein thrombosis: Secondary | ICD-10-CM | POA: Diagnosis not present

## 2022-09-29 DIAGNOSIS — R1013 Epigastric pain: Secondary | ICD-10-CM | POA: Diagnosis not present

## 2022-09-29 DIAGNOSIS — Z79899 Other long term (current) drug therapy: Secondary | ICD-10-CM | POA: Insufficient documentation

## 2022-09-29 DIAGNOSIS — C258 Malignant neoplasm of overlapping sites of pancreas: Secondary | ICD-10-CM | POA: Insufficient documentation

## 2022-09-29 DIAGNOSIS — Z803 Family history of malignant neoplasm of breast: Secondary | ICD-10-CM | POA: Diagnosis not present

## 2022-09-29 DIAGNOSIS — G8929 Other chronic pain: Secondary | ICD-10-CM | POA: Insufficient documentation

## 2022-09-29 DIAGNOSIS — N132 Hydronephrosis with renal and ureteral calculous obstruction: Secondary | ICD-10-CM | POA: Diagnosis not present

## 2022-09-29 DIAGNOSIS — M549 Dorsalgia, unspecified: Secondary | ICD-10-CM | POA: Diagnosis not present

## 2022-09-29 DIAGNOSIS — F17210 Nicotine dependence, cigarettes, uncomplicated: Secondary | ICD-10-CM | POA: Diagnosis not present

## 2022-09-29 DIAGNOSIS — Z86010 Personal history of colonic polyps: Secondary | ICD-10-CM | POA: Diagnosis not present

## 2022-09-29 DIAGNOSIS — Z8719 Personal history of other diseases of the digestive system: Secondary | ICD-10-CM | POA: Diagnosis not present

## 2022-09-29 DIAGNOSIS — Z801 Family history of malignant neoplasm of trachea, bronchus and lung: Secondary | ICD-10-CM | POA: Insufficient documentation

## 2022-09-29 DIAGNOSIS — Z5111 Encounter for antineoplastic chemotherapy: Secondary | ICD-10-CM | POA: Insufficient documentation

## 2022-09-29 DIAGNOSIS — Z87442 Personal history of urinary calculi: Secondary | ICD-10-CM | POA: Diagnosis not present

## 2022-09-29 DIAGNOSIS — C259 Malignant neoplasm of pancreas, unspecified: Secondary | ICD-10-CM

## 2022-09-29 NOTE — Patient Instructions (Addendum)
[facility]  Discharge Instructions  You were seen and examined today by Dr. Ellin Saba.  The recommendation remains that you have chemotherapy.   Please call us as soon as you have the Medicaid number.  Follow-up as scheduled.  Thank you for choosing [facility] to provide your oncology and hematology care.   To afford each patient quality time with our provider, please arrive at least 15 minutes before your scheduled appointment time. You may need to reschedule your appointment if you arrive late (10 or more minutes). Arriving late affects you and other patients whose appointments are after yours.  Also, if you miss three or more appointments without notifying the office, you may be dismissed from the clinic at the provider's discretion.    Again, thank you for choosing [facility].  Our hope is that these requests will decrease the amount of time that you wait before being seen by our physicians.   If you have a lab appointment with the Cancer Center - please note that after April 8th, all labs will be drawn in the cancer center.  You do not have to check in or register with the main entrance as you have in the past but will complete your check-in at the cancer center.            _____________________________________________________________  Should you have questions after your visit to [facility], please contact our office at [phone] and follow the prompts.  Our office hours are 8:00 a.m. to 4:30 p.m. Monday - Thursday and 8:00 a.m. to 2:30 p.m. Friday.  Please note that voicemails left after 4:00 p.m. may not be returned until the following business day.  We are closed weekends and all major holidays.  You do have access to a nurse 24-7, just call the main number to the clinic [phone] and do not press any options, hold on the line and a nurse will answer the phone.    For prescription refill  requests, have your pharmacy contact our office and allow 72 hours.    Masks are no longer required in the cancer centers. If you would like for your care team to wear a mask while they are taking care of you, please let them know. You may have one support person who is at least 58 years old accompany you for your appointments.

## 2022-10-03 ENCOUNTER — Other Ambulatory Visit: Payer: Self-pay

## 2022-10-06 ENCOUNTER — Encounter: Payer: Self-pay | Admitting: Hematology

## 2022-10-06 ENCOUNTER — Inpatient Hospital Stay: Payer: Medicaid Other | Admitting: Licensed Clinical Social Worker

## 2022-10-06 ENCOUNTER — Inpatient Hospital Stay: Payer: Medicaid Other

## 2022-10-06 DIAGNOSIS — C259 Malignant neoplasm of pancreas, unspecified: Secondary | ICD-10-CM

## 2022-10-06 DIAGNOSIS — Z5111 Encounter for antineoplastic chemotherapy: Secondary | ICD-10-CM | POA: Diagnosis not present

## 2022-10-06 LAB — COMPREHENSIVE METABOLIC PANEL
ALT: 28 U/L (ref 0–44)
AST: 21 U/L (ref 15–41)
Albumin: 4.1 g/dL (ref 3.5–5.0)
Alkaline Phosphatase: 95 U/L (ref 38–126)
Anion gap: 9 (ref 5–15)
BUN: 12 mg/dL (ref 6–20)
CO2: 24 mmol/L (ref 22–32)
Calcium: 9 mg/dL (ref 8.9–10.3)
Chloride: 103 mmol/L (ref 98–111)
Creatinine, Ser: 0.93 mg/dL (ref 0.61–1.24)
GFR, Estimated: >60 mL/min (ref >60)
Glucose, Bld: 176 mg/dL — ABNORMAL HIGH (ref 70–99)
Potassium: 3.9 mmol/L (ref 3.5–5.1)
Sodium: 136 mmol/L (ref 135–145)
Total Bilirubin: 0.8 mg/dL (ref 0.3–1.2)
Total Protein: 7.7 g/dL (ref 6.5–8.1)

## 2022-10-06 LAB — CBC WITH DIFFERENTIAL/PLATELET
Abs Immature Granulocytes: 0.01 10*3/uL (ref 0.00–0.07)
Basophils Absolute: 0.1 10*3/uL (ref 0.0–0.1)
Basophils Relative: 1 %
Eosinophils Absolute: 0.2 10*3/uL (ref 0.0–0.5)
Eosinophils Relative: 3 %
HCT: 47.4 % (ref 39.0–52.0)
Hemoglobin: 15.5 g/dL (ref 13.0–17.0)
Immature Granulocytes: 0 %
Lymphocytes Relative: 30 %
Lymphs Abs: 2.6 10*3/uL (ref 0.7–4.0)
MCH: 30 pg (ref 26.0–34.0)
MCHC: 32.7 g/dL (ref 30.0–36.0)
MCV: 91.9 fL (ref 80.0–100.0)
Monocytes Absolute: 0.7 10*3/uL (ref 0.1–1.0)
Monocytes Relative: 8 %
Neutro Abs: 5 10*3/uL (ref 1.7–7.7)
Neutrophils Relative %: 58 %
Platelets: 121 10*3/uL — ABNORMAL LOW (ref 150–400)
RBC: 5.16 MIL/uL (ref 4.22–5.81)
RDW: 12.9 % (ref 11.5–15.5)
WBC: 8.5 10*3/uL (ref 4.0–10.5)
nRBC: 0 % (ref 0.0–0.2)

## 2022-10-06 LAB — MAGNESIUM: Magnesium: 2.1 mg/dL (ref 1.7–2.4)

## 2022-10-06 MED ORDER — HEPARIN SOD (PORK) LOCK FLUSH 100 UNIT/ML IV SOLN
500.0000 [IU] | Freq: Once | INTRAVENOUS | Status: AC
  Administered 2022-10-06: 500 [IU] via INTRAVENOUS

## 2022-10-06 MED ORDER — SODIUM CHLORIDE 0.9% FLUSH
10.0000 mL | Freq: Once | INTRAVENOUS | Status: AC
  Administered 2022-10-06: 10 mL

## 2022-10-06 MED ORDER — PROCHLORPERAZINE MALEATE 10 MG PO TABS
10.0000 mg | ORAL_TABLET | Freq: Four times a day (QID) | ORAL | 3 refills | Status: DC | PRN
Start: 2022-10-06 — End: 2023-11-17

## 2022-10-06 MED ORDER — LIDOCAINE-PRILOCAINE 2.5-2.5 % EX CREA: 1.0000 | TOPICAL_CREAM | CUTANEOUS | 3 refills | Status: AC | PRN

## 2022-10-06 NOTE — Patient Instructions (Signed)
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 You will be treated in the clinic every 2 weeks with a combination of chemotherapy drugs. Those drugs are oxaliplatin, irinotecan, and fluorouracil (5FU). There is another medication you will receive in the clinic with your treatments called leucovorin. This is not a chemotherapy drug. This is a vitamin that we give you to help the 5FU chemo we give work better. The intent of treatment is to shrink and control this cancer, prevent it from spreading further, and to alleviate any symptoms you may be having related to the cancer. You will see the doctor regularly throughout treatment.  We will obtain blood work from you prior to every treatment and monitor your results to make sure it is safe to give your treatment. The doctor monitors your response to treatment by the way you are feeling, your blood work, and by obtaining scans periodically. There will be wait times while you are here for treatment. It will take about 30 minutes to 1 hour for your lab work to result. Then there will be wait times while pharmacy mixes your medications.    Medications you will receive in the clinic prior to your chemotherapy medications:  Aloxi:  ALOXI is used in adults to help prevent nausea and vomiting that happens with certain chemotherapy drugs.  Aloxi is a long acting medication, and will remain in your system for about two days.   Emend:  This is an anti-nausea medication that is used with Aloxi to help prevent nausea and vomiting caused by chemotherapy.  Dexamethasone:  This is a steroid given prior to chemotherapy to help prevent allergic reactions; it may also help prevent and control nausea and diarrhea.    Oxaliplatin (Eloxatin)  About This Drug  Oxaliplatin is used to treat cancer. It is given in the vein (IV).  It takes two hours to infuse.  Possible Side Effects   Bone  marrow suppression. This is a decrease in the number of white blood cells, red blood cells, and platelets. This may raise your risk of infection, make you tired and weak (fatigue), and raise your risk of bleeding.   Tiredness   Soreness of the mouth and throat. You may have red areas, white patches, or sores that hurt.   Nausea and vomiting (throwing up)   Diarrhea (loose bowel movements)   Changes in your liver function   Effects on the nerves called peripheral neuropathy. You may feel numbness, tingling, or pain in your hands and feet, and may be worse in cold temperatures. It may be hard for you to button your clothes, open jars, or walk as usual. The effect on the nerves may get worse with more doses of the drug. These effects get better in some people after the drug is stopped but it does not get better in all people  Note: Each of the side effects above was reported in 40% or greater of patients treated with oxaliplatin. Not all possible side effects are included above.   Warnings and Precautions   Allergic reactions, including anaphylaxis, which may be life-threatening are rare but may happen in some patients. Signs of allergic reaction to this drug may be swelling of the face, feeling like your tongue or throat are swelling, trouble breathing, rash, itching, fever, chills, feeling dizzy, and/or feeling that your heart is beating in a fast or not normal way. If this happens, do not take  another dose of this drug. You should get urgent medical treatment.   Inflammation (swelling) of the lungs, which may be life-threatening. You may have a dry cough or trouble breathing.   Effects on the nerves (neuropathy) may resolve within 14 days, or it may persist beyond 14 days.   Severe decrease in white blood cells when combined with the chemotherapy agents 5-fluorouracil and leucovorin. This may be life-threatening.   Severe changes in your liver function   Abnormal heart beat and/or EKG,  which can be life-threatening   Rhabdomyolysis- damage to your muscles which may release proteins in your blood and affect how your kidneys work, which can be life-threatening. You may have severe muscle weakness and/or pain, or dark urine.  Important Information   This drug may impair your ability to drive or use machinery. Talk to your doctor and/or nurse about precautions you may need to take.   This drug may be present in the saliva, tears, sweat, urine, stool, vomit, semen, and vaginal secretions. Talk to your doctor and/or your nurse about the necessary precautions to take during this time.  * The effects on the nerves can be aggravated by exposure to cold. Avoid cold beverages, use of ice and make sure you cover your skin and dress warmly prior to being exposed to cold temperatures while you are receiving treatment with oxaliplatin*   Treating Side Effects   Manage tiredness by pacing your activities for the day.   Be sure to include periods of rest between energy-draining activities.   To decrease the risk of infection, wash your hands regularly.   Avoid close contact with people who have a cold, the flu, or other infections.  Take your temperature as your doctor or nurse tells you, and whenever you feel like you may have a fever.   To help decrease the risk of bleeding, use a soft toothbrush. Check with your nurse before using dental floss.   Be very careful when using knives or tools.   Use an electric shaver instead of a razor.   Drink plenty of fluids (a minimum of eight glasses per day is recommended).   Mouth care is very important. Your mouth care should consist of routine, gentle cleaning of your teeth or dentures and rinsing your mouth with a mixture of 1/2 teaspoon of salt in 8 ounces of water or 1/2 teaspoon of baking soda in 8 ounces of water. This should be done at least after each meal and at bedtime.   If you have mouth sores, avoid mouthwash that has alcohol.  Also avoid alcohol and smoking because they can bother your mouth and throat.   To help with nausea and vomiting, eat small, frequent meals instead of three large meals a day. Choose foods and drinks that are at room temperature. Ask your nurse or doctor about other helpful tips and medicine that is available to help stop or lessen these symptoms.   If you throw up or have loose bowel movements, you should drink more fluids so that you do not become dehydrated (lack of water in the body from losing too much fluid).   If you have diarrhea, eat low-fiber foods that are high in protein and calories and avoid foods that can irritate your digestive tracts or lead to cramping.   Ask your nurse or doctor about medicine that can lessen or stop your diarrhea.   If you have numbness and tingling in your hands and feet, be careful when cooking, walking,  and handling sharp objects and hot liquids.   Do not drink cold drinks or use ice in beverages. Drink fluids at room temperature or warmer, and drink through a straw.   Wear gloves to touch cold objects, and wear warm clothing and cover you skin during cold weather.   Food and Drug Interactions   There are no known interactions of oxaliplatin with food and other medications.   This drug may interact with other medicines. Tell your doctor and pharmacist about all the prescription and over-the-counter medicines and dietary supplements (vitamins, minerals, herbs and others) that you are taking at this time. Also, check with your doctor or pharmacist before starting any new prescription or over-the-counter medicines, or dietary supplements to make sure that there are no interactions   When to Call the Doctor  Call your doctor or nurse if you have any of these symptoms and/or any new or unusual symptoms:   Fever of 100.4 F (38 C) or higher   Chills   Tiredness that interferes with your daily activities   Feeling dizzy or lightheaded   Easy  bleeding or bruising   Feeling that your heart is beating in a fast or not normal way (palpitations)   Pain in your chest   Dry cough   Trouble breathing   Pain in your mouth or throat that makes it hard to eat or drink   Nausea that stops you from eating or drinking and/or is not relieved by prescribed medicines   Throwing up   Diarrhea, 4 times in one day or diarrhea with lack of strength or a feeling of being dizzy   Numbness, tingling, or pain in your hands and feet   Signs of possible liver problems: dark urine, pale bowel movements, bad stomach pain, feeling very tired and weak, unusual itching, or yellowing of the eyes or skin   Signs of rhabdomyolysis: decreased urine, very dark urine, muscle pain in the shoulders, thighs, or lower back; muscle weakness or trouble moving arms and legs   Signs of allergic reaction: swelling of the face, feeling like your tongue or throat are swelling, trouble breathing, rash, itching, fever, chills, feeling dizzy, and/or feeling that your heart is beating in a fast or not normal way. If this happens, call 911 for emergency care.   If you think you may be pregnant  Reproduction Warnings   Pregnancy warning: This drug may have harmful effects on the unborn baby. Women of childbearing potential should use effective methods of birth control during your cancer treatment. Let your doctor know right away if you think you may be pregnant or may have impregnated your partner.   Breastfeeding warning: It is not known if this drug passes into breast milk. For this reason, women should talk to their doctor about the risks and benefits of breastfeeding during treatment with this drug because this drug may enter the breast milk and cause harm to a breastfeeding baby.   Fertility warning: Human fertility studies have not been done with this drug. Talk with your doctor or nurse if you plan to have children. Ask for information on sperm or egg  banking.   Irinotecan (Camptosar)    About This Drug  Irinotecan is used to treat cancer. It is given in the vein (IV). It will take 1.5 hours to infuse.    Possible Side Effects   Bone marrow suppression. This is a decrease in the number of white blood cells, red blood cells, and platelets. This  may raise your risk of infection, make you tired and weak, and raise your risk of bleeding.    Soreness of the mouth and throat. You may have red areas, white patches, or sores that hurt.    Nausea and vomiting (throwing up)    Pain in your abdomen  Diarrhea (loose bowel movements)    Constipation (unable to move bowels)    Decreased appetite (decreased hunger)    Weight loss    Changes in your liver function    Pain    Weakness    Fever    Infection    Hair loss. Hair loss is often temporary, although with certain medicine, hair loss can sometimes be permanent. Hair loss may happen suddenly or gradually. If you lose hair, you may lose it from your head, face, armpits, pubic area, chest, and/or legs. You may also notice your hair getting thin.   Note: Each of the side effects above was reported in 30% or greater of patients treated with irinotecan alone or in combination with other chemotherapy. Not all possible side effects are included above.   Warnings and Precautions    Severe diarrhea and colitis which is swelling (inflammation) in the colon - symptoms are diarrhea, stomach cramping, and sometimes blood in the bowel movements. Very rarely, an abnormal hole in your stomach, small and/or large intestine can happen. Diarrhea can begin shortly after the infusion or up to a week or two after, and can be life-threatening if it leads to dehydration, and other complications.     Severe bone marrow suppression which can be life-threatening    Allergic reactions, including anaphylaxis are rare but may happen in some patients. Signs of allergic reaction to this drug may be swelling of  the face, feeling like your tongue or throat are swelling, trouble breathing, rash, itching, fever, chills, feeling dizzy, and/or feeling that your heart is beating in a fast or not normal way. If this happens, do not take another dose of this drug. You should get urgent medical treatment.    Changes in your kidney function which can cause kidney failure and be life-threatening    Inflammation (swelling) or scarring of the lungs, which can be life-threatening. You may have a cough or trouble breathing. Note: Some of the side effects above are very rare. If you have concerns and/or questions, please discuss them with your medical team. Important Information    This drug may be present in the saliva, tears, sweat, urine, stool, vomit, semen, and vaginal secretions. Talk to your doctor and/or your nurse about the necessary precautions to take during this time.    It is important that you notify your doctor and/or nurse at the first sign of diarrhea, so they can provide you with anti-diarrheal medication and give you further instructions. Notify your doctor and/ or nurse if you are taking anti-diarrheal medication and your symptoms have not improved or are worsening.    Treating Side Effects    Manage tiredness by pacing your activities for the day.    Be sure to include periods of rest between energy-draining activities.    To decrease the risk of infection, wash your hands regularly.  Avoid close contact with people who have a cold, the flu, or other infections.    Take your temperature as your doctor or nurse tells you, and whenever you feel like you may have a fever.    To help decrease the risk of bleeding, use a soft toothbrush. Check with  your nurse before using dental floss.    Be very careful when using knives or tools.    Use an electric shaver instead of a razor.    Drink plenty of fluids (a minimum of eight glasses per day is recommended).    If you throw up or have diarrhea, you  should drink more fluids so that you do not become dehydrated (lack of water in the body from losing too much fluid).    Mouth care is very important. Your mouth care should consist of routine, gentle cleaning of your teeth or dentures and rinsing your mouth with a mixture of 1/2 teaspoon of salt in 8 ounces of water or 1/2 teaspoon of baking soda in 8 ounces of water. This should be done at least after each meal and at bedtime.    If you have mouth sores, avoid mouthwash that has alcohol. Also avoid alcohol and smoking because they can bother your mouth and throat.     To help with nausea and vomiting, eat small, frequent meals instead of three large meals a day. Choose foods and drinks that are at room temperature. Ask your nurse or doctor about other helpful tips and medicine that is available to help stop or lessen these symptoms.    If you have diarrhea, eat low-fiber foods that are high in protein and calories and avoid foods that can irritate your digestive tracts or lead to cramping.    If you are not able to move your bowels, check with your doctor or nurse before you use enemas, laxatives, or suppositories.    Ask your doctor or nurse about medicines that are available to help stop or lessen constipation and/ or diarrhea.    To help with decreased appetite, eat small, frequent meals. Eat foods high in calories and protein, such as meat, poultry, fish, dry beans, tofu, eggs, nuts, milk, yogurt, cheese, ice cream, pudding, and nutritional supplements.    Consider using sauces and spices to increase taste. Daily exercise, with your doctor's approval, may increase your appetite.    To help with weight loss, drink fluids that contribute calories (whole milk, juice, soft drinks, sweetened beverages, milkshakes, and nutritional supplements) instead of water.    Keeping your pain under control is important to your well-being. Please tell your doctor or nurse if you are experiencing pain.     To help with hair loss, wash with a mild shampoo and avoid washing your hair every day. Avoid coloring your hair.    Avoid rubbing your scalp, pat your hair or scalp dry.    Limit your use of hair spray, electric curlers, blow dryers, and curling irons.    If you are interested in getting a wig, talk to your nurse and they can help you get in touch with programs in your local area.    Food and Drug Interactions    This drug may interact with grapefruit and grapefruit juice. Talk to your doctor as this could make side effects worse.    Check with your doctor or pharmacist about all other prescription medicines and over-the-counter medicines and dietary supplements (vitamins, minerals, herbs, and others) you are taking before starting this medicine as there are known drug interactions with irinotecan. Also, check with your doctor or pharmacist before starting any new prescription or over-the-counter medicines, or dietary supplements to make sure that there are no interactions.    Avoid the use of St. John's Wort while taking irinotecan as this may lower  the levels of the drug in your body, which can make it less effective.    When to Call the Doctor   Call your doctor or nurse if you have any of these symptoms and/or any new or unusual symptoms:    Fever of 100.4 F (38 C) or higher    Chills    Pain in your chest     Dry cough    Trouble breathing and/or wheezing    Feeling dizzy or lightheaded    Easy bleeding or bruising    Tiredness or weakness that interferes with your daily activities    Pain in your mouth or throat that makes it hard to eat or drink    Nausea that stops you from eating or drinking and/or is not relieved by prescribed medicines    Throwing up   Diarrhea, 4 times in one day or diarrhea with lack of strength or a feeling of being dizzy    No bowel movement in 3 days or when you feel uncomfortable    Severe abdominal pain that does not go away     Difficulty swallowing    General pain that does not go away, or is not relieved by prescribed medicines    Blood in your stool    Lasting loss of appetite or rapid weight loss of five pounds in a week    Decreased or very dark urine    Signs of allergic reaction: swelling of the face, feeling like your tongue or throat are swelling, trouble breathing, rash, itching, fever, chills, feeling dizzy, and/or feeling that your heart is beating in a fast or not normal way. If this happens, call 911 for emergency care.    Signs of possible liver problems: dark urine, pale bowel movements, pain in your abdomen, feeling very tired and weak, unusual itching, or yellowing of the eyes or skin    If you think you may be pregnant or may have impregnated your partner    Reproduction Warnings    Pregnancy warning: This drug can have harmful effects on the unborn baby. Women of childbearing potential should use effective methods of birth control during your cancer treatment and for 6 months after treatment. Men with male partners of childbearing potential should use effective methods of birth control during your cancer treatment and for 3 months after your cancer treatment. Let your doctor know right away if you think you may be pregnant or may have impregnated your partner.    Breastfeeding warning: Women should not breastfeed during treatment and for at least 7 days after treatment because this drug can enter the breast milk and cause harm to a breastfeeding baby.  Fertility warning: In men and women both, this drug may affect your ability to have children in the future. Talk with your doctor or nurse if you plan to have children. Ask for information on sperm or egg banking. Revised August 2021   Leucovorin Calcium  About This Drug  Leucovorin is a vitamin. It is used in combination with other cancer fighting drugs such as 5-fluorouracil and methotrexate. Leucovorin is given in the vein (IV).  This drug  runs at the same time as the oxaliplatin and takes 2 hours to infuse.   Possible Side Effects  Rash and itching  Note: Leucovorin by itself has very few side effects. Other side effects you may have can be caused by the other drugs you are taking, such as 5-fluorouracil.    Warnings and Precautions  Allergic reactions, including anaphylaxis are rare but may happen in some patients. Signs of allergic reaction to this drug may be swelling of the face, feeling like your tongue or throat are swelling, trouble breathing, rash, itching, fever, chills, feeling dizzy, and/or feeling that your heart is beating in a fast or not normal way. If this happens, do not take another dose of this drug. You should get urgent medical treatment.   Food and Drug Interactions   There are no known interactions of leucovorin with food.   This drug may interact with other medicines. Tell your doctor and pharmacist about all the prescription and over-the-counter medicines and dietary supplements (vitamins, minerals, herbs and others) that you are taking at this time.   Also, check with your doctor or pharmacist before starting any new prescription or over-the-counter medicines, or dietary supplements to make sure that there are no interactions.   When to Call the Doctor  Call your doctor or nurse if you have any of these symptoms and/or any new or unusual symptoms:   A new rash or a rash that is not relieved by prescribed medicines   Signs of allergic reaction: swelling of the face, feeling like your tongue or throat are swelling, trouble breathing, rash, itching, fever, chills, feeling dizzy, and/or feeling that your heart is beating in a fast or not normal way. If this happens, call 911 for emergency care.   If you think you may be pregnant   Reproduction Warnings   Pregnancy warning: It is not known if this drug may harm an unborn child. For this reason, be sure to talk with your doctor if you are  pregnant or planning to become pregnant while receiving this drug. Let your doctor know right away if you think you may be pregnant   Breastfeeding warning: It is not known if this drug passes into breast milk. For this reason, women should talk to their doctor about the risks and benefits of breastfeeding during treatment with this drug because this drug may enter the breast milk and cause harm to a breastfeeding baby.   Fertility warning: Human fertility studies have not been done with this drug. Talk with your doctor or nurse if you plan to have children. Ask for information on sperm or egg banking.   5-Fluorouracil (Adrucil; 5FU)  About This Drug  Fluorouracil is used to treat cancer. It is given in the vein (IV). It is given as an IV push from a syringe and also as a continuous infusion given via an ambulatory pump (a pump you take home and wear for a specified amount of time).  Possible Side Effects   Bone marrow suppression. This is a decrease in the number of white blood cells, red blood cells, and platelets. This may raise your risk of infection, make you tired and weak (fatigue), and raise your risk of bleeding   Changes in the tissue of the heart and/or heart attack. Some changes may happen that can cause your heart to have less ability to pump blood.   Blurred vision or other changes in eyesight   Nausea and throwing up (vomiting)   Diarrhea (loose bowel movements)   Ulcers - sores that may cause pain or bleeding in your digestive tract, which includes your mouth, esophagus, stomach, small/large intestines and rectum   Soreness of the mouth and throat. You may have red areas, white patches, or sores that hurt.   Allergic reactions, including anaphylaxis are rare but may happen in  some patients. Signs of allergic reaction to this drug may be swelling of the face, feeling like your tongue or throat are swelling, trouble breathing, rash, itching, fever, chills, feeling dizzy,  and/or feeling that your heart is beating in a fast or not normal way. If this happens, do not take another dose of this drug. You should get urgent medical treatment.   Sensitivity to light (photosensitivity). Photosensitivity means that you may become more sensitive to the sun and/or light. You may get a skin rash/reaction if you are in the sun or are exposed to sun lamps and tanning beds. Your eyes may water more, mostly in bright light.   Changes in your nail color, nail loss and/or brittle nail   Darkening of the skin, or changes to the color of your skin and/or veins used for infusion   Rash, dry skin, or itching  Note: Not all possible side effects are included above.  Warnings and Precautions   Hand-and-foot syndrome. The palms of your hands or soles of your feet may tingle, become numb, painful, swollen, or red.   Changes in your central nervous system can happen. The central nervous system is made up of your brain and spinal cord. You could feel extreme tiredness, agitation, confusion, hallucinations (see or hear things that are not there), trouble understanding or speaking, loss of control of your bowels or bladder, eyesight changes, numbness or lack of strength to your arms, legs, face, or body, or coma. If you start to have any of these symptoms let your doctor know right away.   Side effects of this drug may be unexpectedly severe in some patients  Note: Some of the side effects above are very rare. If you have concerns and/or questions, please discuss them with your medical team.   Important Information   This drug may be present in the saliva, tears, sweat, urine, stool, vomit, semen, and vaginal secretions. Talk to your doctor and/or your nurse about the necessary precautions to take during this time.  Treating Side Effects   Manage tiredness by pacing your activities for the day.   Be sure to include periods of rest between energy-draining activities.   To help  decrease the risk of infections, wash your hands regularly.   Avoid close contact with people who have a cold, the flu, or other infections.   Take your temperature as your doctor or nurse tells you, and whenever you feel like you may have a fever.   Use a soft toothbrush. Check with your nurse before using dental floss.   Be very careful when using knives or tools.   Use an electric shaver instead of a razor.   If you have a nose bleed, sit with your head tipped slightly forward. Apply pressure by lightly pinching the bridge of your nose between your thumb and forefinger. Call your doctor if you feel dizzy or faint or if the bleeding doesn't stop after 10 to 15 minutes.   Drink plenty of fluids (a minimum of eight glasses per day is recommended).   If you throw up or have loose bowel movements, you should drink more fluids so that you do not  become dehydrated (lack of water in the body from losing too much fluid).   To help with nausea and vomiting, eat small, frequent meals instead of three large meals a day. Choose foods and drinks that are at room temperature. Ask your nurse or doctor about other helpful tips and medicine that is available  to help, stop, or lessen these symptoms.   If you have diarrhea, eat low-fiber foods that are high in protein and calories and avoid foods that can irritate your digestive tracts or lead to cramping.   Ask your nurse or doctor about medicine that can lessen or stop your diarrhea.   Mouth care is very important. Your mouth care should consist of routine, gentle cleaning of your teeth or dentures and rinsing your mouth with a mixture of 1/2 teaspoon of salt in 8 ounces of water or 1/2 teaspoon of baking soda in 8 ounces of water. This should be done at least after each meal and at bedtime.   If you have mouth sores, avoid mouthwash that has alcohol. Also avoid alcohol and smoking because they can bother your mouth and throat.   Keeping your nails  moisturized may help with brittleness.   To help with itching, moisturize your skin several times day.   Use sunscreen with SPF 30 or higher when you are outdoors even for a short time. Cover up when you are out in the sun. Wear wide-brimmed hats, long-sleeved shirts, and pants. Keep your neck, chest, and back covered. Wear dark sun glasses when in the sun or bright lights.   If you get a rash do not put anything on it unless your doctor or nurse says you may. Keep the area around the rash clean and dry. Ask your doctor for medicine if your rash bothers you.   Keeping your pain under control is important to your well-being. Please tell your doctor or nurse if you are experiencing pain.   Food and Drug Interactions   There are no known interactions of fluorouracil with food.   Check with your doctor or pharmacist about all other prescription medicines and over-the-counter medicines and dietary supplements (vitamins, minerals, herbs and others) you are taking before starting this medicine as there are known drug interactions with 5-fluoroucacil. Also, check with your doctor or pharmacist before starting any new prescription or over-the-counter medicines, or dietary supplements to make sure that there are no interactions.   When to Call the Doctor  Call your doctor or nurse if you have any of these symptoms and/or any new or unusual symptoms:   Fever of 100.4 F (38 C) or higher   Chills   Easy bleeding or bruising   Nose bleed that doesn't stop bleeding after 10-15 minutes   Trouble breathing   Feeling dizzy or lightheaded   Feeling that your heart is beating in a fast or not normal way (palpitations)   Chest pain or symptoms of a heart attack. Most heart attacks involve pain in the center of the chest that lasts more than a few minutes. The pain may go away and come back or it can be constant. It can feel like pressure, squeezing, fullness, or pain. Sometimes pain is felt in one  or both arms, the back, neck, jaw, or stomach. If any of these symptoms last 2 minutes, call 911.   Confusion and/or agitation   Hallucinations   Trouble understanding or speaking   Loss of control of bowels or bladder   Blurry vision or changes in your eyesight   Headache that does not go away   Numbness or lack of strength to your arms, legs, face, or body   Nausea that stops you from eating or drinking and/or is not relieved by prescribed medicines   Throwing up    Diarrhea, 4 times in one day  or diarrhea with lack of strength or a feeling of being dizzy   Pain in your mouth or throat that makes it hard to eat or drink   Pain along the digestive tract - especially if worse after eating   Blood in your vomit (bright red or coffee-ground) and/or stools (bright red, or black/tarry)   Coughing up blood   Tiredness that interferes with your daily activities   Painful, red, or swollen areas on your hands or feet or around your nails   A new rash or a rash that is not relieved by prescribed medicines   Develop sensitivity to sunlight/light   Numbness and/or tingling of your hands and/or feet   Signs of allergic reaction: swelling of the face, feeling like your tongue or throat are swelling, trouble breathing, rash, itching, fever, chills, feeling dizzy, and/or feeling that your heart is beating in a fast or not normal way. If this happens, call 911 for emergency care.   If you think you are pregnant or may have impregnated your partner  Reproduction Warnings   Pregnancy warning: This drug may have harmful effects on the unborn baby. Women of child bearing potential should use effective methods of birth control during your cancer treatment and 3 months after treatment. Men with male partners of childbearing potential should use effective methods of birth control during your cancer treatment and for 3 months after your cancer treatment. Let your doctor know right away if you  think you may be pregnant or may have impregnated your partner.   Breastfeeding warning: It is not known if this drug passes into breast milk. For this reason, Women should not breastfeed during treatment because this drug could enter the breast milk and cause harm to a breastfeeding baby.   Fertility warning: In men and women both, this drug may affect your ability to have children in the future. Talk with your doctor or nurse if you plan to have children. Ask for information on sperm or egg banking.   SELF CARE ACTIVITIES WHILE ON CHEMOTHERAPY/IMMUNOTHERAPY:  Hydration Increase your fluid intake and drink at least 64 ounces (2 liters) of water/decaffeinated beverages per day after treatment. You can still have your cup of coffee or soda but these beverages do not count as part of the 64 ounces that you need to drink daily. Limit alcohol intake.  Medications Continue taking your normal prescription medication as prescribed.  If you start any new herbal or new supplements please let us know first to make sure it is safe.  Mouth Care Have teeth cleaned professionally before starting treatment. Keep dentures and partial plates clean. Use soft toothbrush and do not use mouthwashes that contain alcohol. Biotene is a good mouthwash that is available at most pharmacies or may be ordered by calling [phone]. Use warm salt water gargles (1 teaspoon salt per 1 quart warm water) before and after meals and at bedtime. If you are still having problems with your mouth or sores in your mouth please call the clinic. If you need dental work, please let the doctor know before you go for your appointment so that we can coordinate the best possible time for you in regards to your chemo regimen. You need to also let your dentist know that you are actively taking chemo. We may need to do labs prior to your dental appointment.  Skin Care Always use sunscreen that has not expired and with SPF (Sun Protection  Factor) of 50 or higher. Wear hats  to protect your head from the sun. Remember to use sunscreen on your hands, ears, face, & feet.  Use good moisturizing lotions such as udder cream, eucerin, or even Vaseline. Some chemotherapies can cause dry skin, color changes in your skin and nails.    Avoid long, hot showers or baths. Use gentle, fragrance-free soaps and laundry detergent. Use moisturizers, preferably creams or ointments rather than lotions because the thicker consistency is better at preventing skin dehydration. Apply the cream or ointment within 15 minutes of showering. Reapply moisturizer at night, and moisturize your hands every time after you wash them.   Infection Prevention Please wash your hands for at least 30 seconds using warm soapy water. Handwashing is the #1 way to prevent the spread of germs. Stay away from sick people or people who are getting over a cold. If you develop respiratory systems such as green/yellow mucus production or productive cough or persistent cough let us know and we will see if you need an antibiotic. It is a good idea to keep a pair of gloves on when going into grocery stores/[employer] to decrease your risk of coming into contact with germs on the carts, etc. Carry alcohol hand gel with you at all times and use it frequently if out in public. If your temperature reaches 100.5 or higher please call the clinic and let us know.  If it is after hours or on the weekend please go to the ER if your temperature is over 100.4.  Please have your own personal thermometer at home to use.    Sex and bodily fluids If you are going to have sex, a condom must be used to protect the person that isn't taking immunotherapy. For a few days after treatment, immunotherapy can be excreted through your bodily fluids.  When using the toilet please close the lid and flush the toilet twice.  Do this for a few day after you have had immunotherapy.   Contraception It is not known for sure  whether or not immunotherapy drugs can be passed on through semen or secretions from the vagina. Because of this some doctors advise people to use a barrier method if you have sex during treatment. This applies to vaginal, anal or oral sex.  Generally, doctors advise a barrier method only for the time you are actually having the treatment and for about a week after your treatment.  Advice like this can be worrying, but this does not mean that you have to avoid being intimate with your partner. You can still have close contact with your partner and continue to enjoy sex.  Animals If you have cats or birds we ask that you not change the litter or change the cage.  Please have someone else do this for you while you are on immunotherapy.   Food Safety During and After Cancer Treatment Food safety is important for people both during and after cancer treatment. Cancer and cancer treatments, such as chemotherapy, radiation therapy, and stem cell/bone marrow transplantation, often weaken the immune system. This makes it harder for your body to protect itself from foodborne illness, also called food poisoning. Foodborne illness is caused by eating food that contains harmful bacteria, parasites, or viruses.  Foods to avoid Some foods have a higher risk of becoming tainted with bacteria. These include: Unwashed fresh fruit and vegetables, especially leafy vegetables that can hide dirt and other contaminants Raw sprouts, such as alfalfa sprouts Raw or undercooked beef, especially ground beef, or other raw  or undercooked meat and poultry Fatty, fried, or spicy foods immediately before or after treatment.  These can sit heavy on your stomach and make you feel nauseous. Raw or undercooked shellfish, such as oysters. Sushi and sashimi, which often contain raw fish.  Unpasteurized beverages, such as unpasteurized fruit juices, raw milk, raw yogurt, or cider Undercooked eggs, such as soft boiled, over easy, and  poached; raw, unpasteurized eggs; or foods made with raw egg, such as homemade raw cookie dough and homemade mayonnaise  Simple steps for food safety  Shop smart. Do not buy food stored or displayed in an unclean area. Do not buy bruised or damaged fruits or vegetables. Do not buy cans that have cracks, dents, or bulges. Pick up foods that can spoil at the end of your shopping trip and store them in a cooler on the way home.  Prepare and clean up foods carefully. Rinse all fresh fruits and vegetables under running water, and dry them with a clean towel or paper towel. Clean the top of cans before opening them. After preparing food, wash your hands for 20 seconds with hot water and soap. Pay special attention to areas between fingers and under nails. Clean your utensils and dishes with hot water and soap. Disinfect your kitchen and cutting boards using 1 teaspoon of liquid, unscented bleach mixed into 1 quart of water.    Dispose of old food. Eat canned and packaged food before its expiration date (the "use by" or "best before" date). Consume refrigerated leftovers within 3 to 4 days. After that time, throw out the food. Even if the food does not smell or look spoiled, it still may be unsafe. Some bacteria, such as Listeria, can grow even on foods stored in the refrigerator if they are kept for too long.  Take precautions when eating out. At restaurants, avoid buffets and salad bars where food sits out for a long time and comes in contact with many people. Food can become contaminated when someone with a virus, often a norovirus, or another "bug" handles it. Put any leftover food in a "to-go" container yourself, rather than having the server do it. And, refrigerate leftovers as soon as you get home. Choose restaurants that are clean and that are willing to prepare your food as you order it cooked.    SYMPTOMS TO REPORT AS SOON AS POSSIBLE AFTER TREATMENT:  FEVER GREATER THAN 100.4  F CHILLS WITH OR WITHOUT FEVER NAUSEA AND VOMITING THAT IS NOT CONTROLLED WITH YOUR NAUSEA MEDICATION UNUSUAL SHORTNESS OF BREATH UNUSUAL BRUISING OR BLEEDING TENDERNESS IN MOUTH AND THROAT WITH OR WITHOUT PRESENCE OF ULCERS URINARY PROBLEMS BOWEL PROBLEMS UNUSUAL RASH     Wear comfortable clothing and clothing appropriate for easy access to any Portacath or PICC line. Let us know if there is anything that we can do to make your therapy better!   What to do if you need assistance after hours or on the weekends: CALL [phone].  HOLD on the line, do not hang up.  You will hear multiple messages but at the end you will be connected with a nurse triage line.  They will contact the doctor if necessary.  Most of the time they will be able to assist you.  Do not call the hospital operator.    I have been informed and understand all of the instructions given to me and have received a copy. I have been instructed to call the clinic [phone] or my family physician as  soon as possible for continued medical care, if indicated. I do not have any more questions at this time but understand that I may call the Cancer Center or the Patient Navigator at [phone] during office hours should I have questions or need assistance in obtaining follow-up care.

## 2022-10-06 NOTE — Progress Notes (Signed)
Chemotherapy education packet given and discussed with pt in detail.  Discussed diagnosis, staging, tx regimen, and intent of tx.  Reviewed chemotherapy medications and side effects, as well as pre-medications.  Instructed on how to manage side effects at home, and when to call the clinic.  Importance of fever/chills discussed with pt. Discussed precautions to implement at home after receiving tx, as well as self care strategies. Phone numbers provided for clinic during regular working hours, also how to reach the clinic after hours and on weekends. Pt provided the opportunity to ask questions - all questions answered to pt's satisfaction.    

## 2022-10-06 NOTE — Progress Notes (Signed)
[facility] Clinical Social Work  Initial Assessment   Patrick Rollins is a 57 y.o. year old male presenting alone. Clinical Social Work was referred by medical provider for assessment of psychosocial needs.   SDOH (Social Determinants of Health) assessments performed: Yes   SDOH Screenings   Tobacco Use: High Risk (09/29/2022)     Distress Screen completed: No     No data to display            Family/Social Information:  Housing Arrangement: patient lives with his wife Patrick Rollins  Family members/support persons in your life? Pt states he has family close by that can offer some limited support.  Pt's wife works and is the only income for the household. 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  Pt has not been able to work since the beginning of May when he had surgery and will not be eligible for long term social security disability as he doesn't have work credits accrued.  Income source: Spouse's income is the only income for the household.  Pt states he applied for SSI, but was informed his wife makes too much income for him to qualify.  Pt did qualify for Medicaid and is applying for SNAP benefits. Financial concerns: Yes, current concerns Type of concern: Utilities, Medical bills, and Food Food access concerns: no Religious or spiritual practice: Not known Services Currently in place:  none  Coping/ Adjustment to diagnosis: Patient understands treatment plan and what happens next? yes Concerns about diagnosis and/or treatment: Overwhelmed by information, Afraid of cancer, How I will pay for the services I need, and Quality of life Patient reported stressors: Finances, Anxiety/ nervousness, Adjusting to my illness, and Facing my mortality Hopes and/or priorities: Pt's priority is to start treatment w/ the hope of positive results Patient enjoys  not  addressed Current coping skills/ strengths: Capable of independent living , Motivation for treatment/growth , Physical Health , and Supportive family/friends     SUMMARY: Current SDOH Barriers:  Financial constraints related to loss of income  Clinical Social Work Clinical Goal(s):  Explore community resource options for unmet needs related to:  Financial Strain   Interventions: Discussed common feeling and emotions when being diagnosed with cancer, and the importance of support during treatment Informed patient of the support team roles and support services at [facility] Provided CSW contact information and encouraged patient to call with any questions or concerns Provided patient with information about the [employer] and how to apply.  Referred to Marijean Niemann for additional supportive services.  Once pt is started into treatment will follow up w/ 2 additional grants which are specifically for pancreatic cancer.   Follow Up Plan: Patient will contact CSW with any support or resource needs Patient verbalizes understanding of plan: Yes    Patrick Moulds, LCSW Clinical Social Worker [facility]  Patient is participating in a Managed Medicaid Plan:  Yes

## 2022-10-06 NOTE — Progress Notes (Signed)
Truddie Hidden presented for Portacath access and flush with labs. Portacath located right chest wall accessed with  H 20 needle.  Good blood return present. Portacath flushed with 20ml NS and 500U/97ml Heparin and needle removed intact.  Procedure tolerated well and without incident.  Discharged from clinic ambulatory in stable condition. Alert and oriented x 3. F/U with [facility] as scheduled.

## 2022-10-06 NOTE — Patient Instructions (Signed)
[facility]  Discharge Instructions: Thank you for choosing [facility] to provide your oncology and hematology care.  If you have a lab appointment with the Cancer Center - please note that after April 8th, 2024, all labs will be drawn in the cancer center.  You do not have to check in or register with the main entrance as you have in the past but will complete your check-in in the cancer center.  Wear comfortable clothing and clothing appropriate for easy access to any Portacath or PICC line.   We strive to give you quality time with your provider. You may need to reschedule your appointment if you arrive late (15 or more minutes).  Arriving late affects you and other patients whose appointments are after yours.  Also, if you miss three or more appointments without notifying the office, you may be dismissed from the clinic at the provider's discretion.      For prescription refill requests, have your pharmacy contact our office and allow 72 hours for refills to be completed.    Today you received the following chemotherapy and/or immunotherapy agents port flush and lab work.       To help prevent nausea and vomiting after your treatment, we encourage you to take your nausea medication as directed.  BELOW ARE SYMPTOMS THAT SHOULD BE REPORTED IMMEDIATELY: *FEVER GREATER THAN 100.4 F (38 C) OR HIGHER *CHILLS OR SWEATING *NAUSEA AND VOMITING THAT IS NOT CONTROLLED WITH YOUR NAUSEA MEDICATION *UNUSUAL SHORTNESS OF BREATH *UNUSUAL BRUISING OR BLEEDING *URINARY PROBLEMS (pain or burning when urinating, or frequent urination) *BOWEL PROBLEMS (unusual diarrhea, constipation, pain near the anus) TENDERNESS IN MOUTH AND THROAT WITH OR WITHOUT PRESENCE OF ULCERS (sore throat, sores in mouth, or a toothache) UNUSUAL RASH, SWELLING OR PAIN  UNUSUAL VAGINAL DISCHARGE OR ITCHING   Items with * indicate a potential emergency and should be followed up as soon as  possible or go to the Emergency Department if any problems should occur.  Please show the CHEMOTHERAPY ALERT CARD or IMMUNOTHERAPY ALERT CARD at check-in to the Emergency Department and triage nurse.  Should you have questions after your visit or need to cancel or reschedule your appointment, please contact [facility] [phone]  and follow the prompts.  Office hours are 8:00 a.m. to 4:30 p.m. Monday - Friday. Please note that voicemails left after 4:00 p.m. may not be returned until the following business day.  We are closed weekends and major holidays. You have access to a nurse at all times for urgent questions. Please call the main number to the clinic [phone] and follow the prompts.  For any non-urgent questions, you may also contact your provider using MyChart. We now offer e-Visits for anyone 6 and older to request care online for non-urgent symptoms. For details visit [email or web address].   Also download the MyChart app! Go to the app store, search "MyChart", open the app, select [place], and log in with your MyChart username and password.

## 2022-10-09 ENCOUNTER — Other Ambulatory Visit: Payer: Self-pay

## 2022-10-10 ENCOUNTER — Ambulatory Visit: Payer: PRIVATE HEALTH INSURANCE | Admitting: Hematology

## 2022-10-10 ENCOUNTER — Inpatient Hospital Stay: Payer: Medicaid Other

## 2022-10-10 ENCOUNTER — Ambulatory Visit: Payer: PRIVATE HEALTH INSURANCE

## 2022-10-10 VITALS — BP 138/76 | HR 81 | Temp 97.7°F | Resp 18 | Wt 167.7 lb

## 2022-10-10 DIAGNOSIS — C259 Malignant neoplasm of pancreas, unspecified: Secondary | ICD-10-CM

## 2022-10-10 DIAGNOSIS — Z5111 Encounter for antineoplastic chemotherapy: Secondary | ICD-10-CM | POA: Diagnosis not present

## 2022-10-10 MED ORDER — SODIUM CHLORIDE 0.9 % IV SOLN
2400.0000 mg/m2 | INTRAVENOUS | Status: DC
  Administered 2022-10-10: 5000 mg via INTRAVENOUS
  Filled 2022-10-10: qty 100

## 2022-10-10 MED ORDER — SODIUM CHLORIDE 0.9 % IV SOLN
150.0000 mg | Freq: Once | INTRAVENOUS | Status: AC
  Administered 2022-10-10: 150 mg via INTRAVENOUS
  Filled 2022-10-10: qty 150

## 2022-10-10 MED ORDER — OXALIPLATIN CHEMO INJECTION 100 MG/20ML
85.0000 mg/m2 | Freq: Once | INTRAVENOUS | Status: AC
  Administered 2022-10-10: 150 mg via INTRAVENOUS
  Filled 2022-10-10: qty 20

## 2022-10-10 MED ORDER — PALONOSETRON HCL INJECTION 0.25 MG/5ML
0.2500 mg | Freq: Once | INTRAVENOUS | Status: AC
  Administered 2022-10-10: 0.25 mg via INTRAVENOUS
  Filled 2022-10-10: qty 5

## 2022-10-10 MED ORDER — PROCHLORPERAZINE EDISYLATE 10 MG/2ML IJ SOLN
10.0000 mg | Freq: Once | INTRAMUSCULAR | Status: AC
  Administered 2022-10-10: 10 mg via INTRAVENOUS
  Filled 2022-10-10: qty 2

## 2022-10-10 MED ORDER — ATROPINE SULFATE 1 MG/ML IV SOLN
0.5000 mg | Freq: Once | INTRAVENOUS | Status: AC | PRN
  Administered 2022-10-10: 0.5 mg via INTRAVENOUS

## 2022-10-10 MED ORDER — SODIUM CHLORIDE 0.9 % IV SOLN
400.0000 mg/m2 | Freq: Once | INTRAVENOUS | Status: AC
  Administered 2022-10-10: 760 mg via INTRAVENOUS
  Filled 2022-10-10: qty 38

## 2022-10-10 MED ORDER — SODIUM CHLORIDE 0.9 % IV SOLN
150.0000 mg/m2 | Freq: Once | INTRAVENOUS | Status: AC
  Administered 2022-10-10: 300 mg via INTRAVENOUS
  Filled 2022-10-10: qty 15

## 2022-10-10 MED ORDER — SODIUM CHLORIDE 0.9 % IV SOLN
10.0000 mg | Freq: Once | INTRAVENOUS | Status: AC
  Administered 2022-10-10: 10 mg via INTRAVENOUS
  Filled 2022-10-10: qty 10

## 2022-10-10 MED ORDER — DEXTROSE 5 % IV SOLN: Freq: Once | INTRAVENOUS | Status: AC

## 2022-10-10 NOTE — Progress Notes (Signed)
Patient presents today for chemotherapy infusion.  Patient is in satisfactory condition with no new complaints voiced.  Vital signs are stable.  Labs from 10/06/22 reviewed and all labs are within treatment parameters.  We will proceed with treatment per MD orders.    1325:   Patient called out with increasing nausea.  A note was sent to Dr. Ellin Saba by Lawson Fiscal, RN and Compazine 10 mg IV x one dose was ordered.   Compazine given.  1330:  Patient started to c/o cold chills and sweats.  Patient denied abdominal cramping.  Infusions were stopped.  MD made aware.  RN gave atropine 0.5 mg IV x one dose as preventative.    1345:  Symptoms have subsided.  Ok to resume infusions per Dr. Ellin Saba.  Patient tolerated the remainder of treatment well with no complaints voiced.  Home infusion 5FU pump connected with no issues.  Spill kit given and patient educated.  Patient left ambulatory in stable condition.  Vital signs stable at discharge.  Follow up as scheduled.

## 2022-10-10 NOTE — Progress Notes (Signed)
Pharmacist Chemotherapy Monitoring - Initial Assessment    Anticipated start date: 10/10/22   The following has been reviewed per standard work regarding the patient's treatment regimen: The patient's diagnosis, treatment plan and drug doses, and organ/hematologic function Lab orders and baseline tests specific to treatment regimen  The treatment plan start date, drug sequencing, and pre-medications Prior authorization status  Patient's documented medication list, including drug-drug interaction screen and prescriptions for anti-emetics and supportive care specific to the treatment regimen The drug concentrations, fluid compatibility, administration routes, and timing of the medications to be used The patient's access for treatment and lifetime cumulative dose history, if applicable  The patient's medication allergies and previous infusion related reactions, if applicable   Changes made to treatment plan:  N/A  Follow up needed:  N/A   Stephens Shire, [facility], 10/10/2022  8:34 AM

## 2022-10-10 NOTE — Patient Instructions (Signed)
[facility]  Discharge Instructions: Thank you for choosing [facility] to provide your oncology and hematology care.  If you have a lab appointment with the Cancer Center - please note that after April 8th, 2024, all labs will be drawn in the cancer center.  You do not have to check in or register with the main entrance as you have in the past but will complete your check-in in the cancer center.  Wear comfortable clothing and clothing appropriate for easy access to any Portacath or PICC line.   We strive to give you quality time with your provider. You may need to reschedule your appointment if you arrive late (15 or more minutes).  Arriving late affects you and other patients whose appointments are after yours.  Also, if you miss three or more appointments without notifying the office, you may be dismissed from the clinic at the provider's discretion.      For prescription refill requests, have your pharmacy contact our office and allow 72 hours for refills to be completed.    Today you received the following chemotherapy and/or immunotherapy agents Oxaliplatin/Irinotecan/Leucovorin/Fluorouracil.  Oxaliplatin Injection What is this medication? OXALIPLATIN (ox AL i PLA tin) treats colorectal cancer. It works by slowing down the growth of cancer cells. This medicine may be used for other purposes; ask your health care provider or pharmacist if you have questions. COMMON BRAND NAME(S): Eloxatin What should I tell my care team before I take this medication? They need to know if you have any of these conditions: Heart disease History of irregular heartbeat or rhythm Liver disease Low blood cell levels (white cells, red cells, and platelets) Lung or breathing disease, such as asthma Take medications that treat or prevent blood clots Tingling of the fingers, toes, or other nerve disorder An unusual or allergic reaction to oxaliplatin, other medications,  foods, dyes, or preservatives If you or your partner are pregnant or trying to get pregnant Breast-feeding How should I use this medication? This medication is injected into a vein. It is given by your care team in a hospital or clinic setting. Talk to your care team about the use of this medication in children. Special care may be needed. Overdosage: If you think you have taken too much of this medicine contact a poison control center or emergency room at once. NOTE: This medicine is only for you. Do not share this medicine with others. What if I miss a dose? Keep appointments for follow-up doses. It is important not to miss a dose. Call your care team if you are unable to keep an appointment. What may interact with this medication? Do not take this medication with any of the following: Cisapride Dronedarone Pimozide Thioridazine This medication may also interact with the following: Aspirin and aspirin-like medications Certain medications that treat or prevent blood clots, such as warfarin, apixaban, dabigatran, and rivaroxaban Cisplatin Cyclosporine Diuretics Medications for infection, such as acyclovir, adefovir, amphotericin B, bacitracin, cidofovir, foscarnet, ganciclovir, gentamicin, pentamidine, vancomycin NSAIDs, medications for pain and inflammation, such as ibuprofen or naproxen Other medications that cause heart rhythm changes Pamidronate Zoledronic acid This list may not describe all possible interactions. Give your health care provider a list of all the medicines, herbs, non-prescription drugs, or dietary supplements you use. Also tell them if you smoke, drink alcohol, or use illegal drugs. Some items may interact with your medicine. What should I watch for while using this medication? Your condition will be monitored carefully while  you are receiving this medication. You may need blood work while taking this medication. This medication may make you feel generally unwell.  This is not uncommon as chemotherapy can affect healthy cells as well as cancer cells. Report any side effects. Continue your course of treatment even though you feel ill unless your care team tells you to stop. This medication may increase your risk of getting an infection. Call your care team for advice if you get a fever, chills, sore throat, or other symptoms of a cold or flu. Do not treat yourself. Try to avoid being around people who are sick. Avoid taking medications that contain aspirin, acetaminophen, ibuprofen, naproxen, or ketoprofen unless instructed by your care team. These medications may hide a fever. Be careful brushing or flossing your teeth or using a toothpick because you may get an infection or bleed more easily. If you have any dental work done, tell your dentist you are receiving this medication. This medication can make you more sensitive to cold. Do not drink cold drinks or use ice. Cover exposed skin before coming in contact with cold temperatures or cold objects. When out in cold weather wear warm clothing and cover your mouth and nose to warm the air that goes into your lungs. Tell your care team if you get sensitive to the cold. Talk to your care team if you or your partner are pregnant or think either of you might be pregnant. This medication can cause serious birth defects if taken during pregnancy and for 9 months after the last dose. A negative pregnancy test is required before starting this medication. A reliable form of contraception is recommended while taking this medication and for 9 months after the last dose. Talk to your care team about effective forms of contraception. Do not father a child while taking this medication and for 6 months after the last dose. Use a condom while having sex during this time period. Do not breastfeed while taking this medication and for 3 months after the last dose. This medication may cause infertility. Talk to your care team if you are  concerned about your fertility. What side effects may I notice from receiving this medication? Side effects that you should report to your care team as soon as possible: Allergic reactions--skin rash, itching, hives, swelling of the face, lips, tongue, or throat Bleeding--bloody or black, tar-like stools, vomiting blood or Zaria Taha material that looks like coffee grounds, red or dark Blondina Coderre urine, small red or purple spots on skin, unusual bruising or bleeding Dry cough, shortness of breath or trouble breathing Heart rhythm changes--fast or irregular heartbeat, dizziness, feeling faint or lightheaded, chest pain, trouble breathing Infection--fever, chills, cough, sore throat, wounds that don't heal, pain or trouble when passing urine, general feeling of discomfort or being unwell Liver injury--right upper belly pain, loss of appetite, nausea, light-colored stool, dark yellow or Camauri Craton urine, yellowing skin or eyes, unusual weakness or fatigue Low red blood cell level--unusual weakness or fatigue, dizziness, headache, trouble breathing Muscle injury--unusual weakness or fatigue, muscle pain, dark yellow or Noeli Lavery urine, decrease in amount of urine Pain, tingling, or numbness in the hands or feet Sudden and severe headache, confusion, change in vision, seizures, which may be signs of posterior reversible encephalopathy syndrome (PRES) Unusual bruising or bleeding Side effects that usually do not require medical attention (report to your care team if they continue or are bothersome): Diarrhea Nausea Pain, redness, or swelling with sores inside the mouth or throat Unusual weakness or  fatigue Vomiting This list may not describe all possible side effects. Call your doctor for medical advice about side effects. You may report side effects to FDA at 1-800-FDA-1088. Where should I keep my medication? This medication is given in a hospital or clinic. It will not be stored at home. NOTE: This sheet is a  summary. It may not cover all possible information. If you have questions about this medicine, talk to your doctor, pharmacist, or health care provider.  2024 Elsevier/Gold Standard (2021-12-14 00:00:00)    Irinotecan Injection What is this medication? IRINOTECAN (ir in oh TEE kan) treats some types of cancer. It works by slowing down the growth of cancer cells. This medicine may be used for other purposes; ask your health care provider or pharmacist if you have questions. COMMON BRAND NAME(S): Camptosar What should I tell my care team before I take this medication? They need to know if you have any of these conditions: Dehydration Diarrhea Infection, especially a viral infection, such as chickenpox, cold sores, herpes Liver disease Low blood cell levels (white cells, red cells, and platelets) Low levels of electrolytes, such as calcium, magnesium, or potassium in your blood Recent or ongoing radiation An unusual or allergic reaction to irinotecan, other medications, foods, dyes, or preservatives If you or your partner are pregnant or trying to get pregnant Breast-feeding How should I use this medication? This medication is injected into a vein. It is given by your care team in a hospital or clinic setting. Talk to your care team about the use of this medication in children. Special care may be needed. Overdosage: If you think you have taken too much of this medicine contact a poison control center or emergency room at once. NOTE: This medicine is only for you. Do not share this medicine with others. What if I miss a dose? Keep appointments for follow-up doses. It is important not to miss your dose. Call your care team if you are unable to keep an appointment. What may interact with this medication? Do not take this medication with any of the following: Cobicistat Itraconazole This medication may also interact with the following: Certain antibiotics, such as clarithromycin,  rifampin, rifabutin Certain antivirals for HIV or AIDS Certain medications for fungal infections, such as ketoconazole, posaconazole, voriconazole Certain medications for seizures, such as carbamazepine, phenobarbital, phenytoin Gemfibrozil Nefazodone St. John's wort This list may not describe all possible interactions. Give your health care provider a list of all the medicines, herbs, non-prescription drugs, or dietary supplements you use. Also tell them if you smoke, drink alcohol, or use illegal drugs. Some items may interact with your medicine. What should I watch for while using this medication? Your condition will be monitored carefully while you are receiving this medication. You may need blood work while taking this medication. This medication may make you feel generally unwell. This is not uncommon as chemotherapy can affect healthy cells as well as cancer cells. Report any side effects. Continue your course of treatment even though you feel ill unless your care team tells you to stop. This medication can cause serious side effects. To reduce the risk, your care team may give you other medications to take before receiving this one. Be sure to follow the directions from your care team. This medication may affect your coordination, reaction time, or judgement. Do not drive or operate machinery until you know how this medication affects you. Sit up or stand slowly to reduce the risk of dizzy or fainting spells.  Drinking alcohol with this medication can increase the risk of these side effects. This medication may increase your risk of getting an infection. Call your care team for advice if you get a fever, chills, sore throat, or other symptoms of a cold or flu. Do not treat yourself. Try to avoid being around people who are sick. Avoid taking medications that contain aspirin, acetaminophen, ibuprofen, naproxen, or ketoprofen unless instructed by your care team. These medications may hide a  fever. This medication may increase your risk to bruise or bleed. Call your care team if you notice any unusual bleeding. Be careful brushing or flossing your teeth or using a toothpick because you may get an infection or bleed more easily. If you have any dental work done, tell your dentist you are receiving this medication. Talk to your care team if you or your partner are pregnant or think either of you might be pregnant. This medication can cause serious birth defects if taken during pregnancy and for 6 months after the last dose. You will need a negative pregnancy test before starting this medication. Contraception is recommended while taking this medication and for 6 months after the last dose. Your care team can help you find the option that works for you. Do not father a child while taking this medication and for 3 months after the last dose. Use a condom for contraception during this time period. Do not breastfeed while taking this medication and for 7 days after the last dose. This medication may cause infertility. Talk to your care team if you are concerned about your fertility. What side effects may I notice from receiving this medication? Side effects that you should report to your care team as soon as possible: Allergic reactions--skin rash, itching, hives, swelling of the face, lips, tongue, or throat Dry cough, shortness of breath or trouble breathing Increased saliva or tears, increased sweating, stomach cramping, diarrhea, small pupils, unusual weakness or fatigue, slow heartbeat Infection--fever, chills, cough, sore throat, wounds that don't heal, pain or trouble when passing urine, general feeling of discomfort or being unwell Kidney injury--decrease in the amount of urine, swelling of the ankles, hands, or feet Low red blood cell level--unusual weakness or fatigue, dizziness, headache, trouble breathing Severe or prolonged diarrhea Unusual bruising or bleeding Side effects that  usually do not require medical attention (report to your care team if they continue or are bothersome): Constipation Diarrhea Hair loss Loss of appetite Nausea Stomach pain This list may not describe all possible side effects. Call your doctor for medical advice about side effects. You may report side effects to FDA at 1-800-FDA-1088. Where should I keep my medication? This medication is given in a hospital or clinic. It will not be stored at home. NOTE: This sheet is a summary. It may not cover all possible information. If you have questions about this medicine, talk to your doctor, pharmacist, or health care provider.  2024 Elsevier/Gold Standard (2021-07-12 00:00:00)    Leucovorin Injection What is this medication? LEUCOVORIN (loo koe VOR in) prevents side effects from certain medications, such as methotrexate. It works by increasing folate levels. This helps protect healthy cells in your body. It may also be used to treat anemia caused by low levels of folate. It can also be used with fluorouracil, a type of chemotherapy, to treat colorectal cancer. It works by increasing the effects of fluorouracil in the body. This medicine may be used for other purposes; ask your health care provider or pharmacist if  you have questions. What should I tell my care team before I take this medication? They need to know if you have any of these conditions: Anemia from low levels of vitamin B12 in the blood An unusual or allergic reaction to leucovorin, folic acid, other medications, foods, dyes, or preservatives Pregnant or trying to get pregnant Breastfeeding How should I use this medication? This medication is injected into a vein or a muscle. It is given by your care team in a hospital or clinic setting. Talk to your care team about the use of this medication in children. Special care may be needed. Overdosage: If you think you have taken too much of this medicine contact a poison control center or  emergency room at once. NOTE: This medicine is only for you. Do not share this medicine with others. What if I miss a dose? Keep appointments for follow-up doses. It is important not to miss your dose. Call your care team if you are unable to keep an appointment. What may interact with this medication? Capecitabine Fluorouracil Phenobarbital Phenytoin Primidone Trimethoprim;sulfamethoxazole This list may not describe all possible interactions. Give your health care provider a list of all the medicines, herbs, non-prescription drugs, or dietary supplements you use. Also tell them if you smoke, drink alcohol, or use illegal drugs. Some items may interact with your medicine. What should I watch for while using this medication? Your condition will be monitored carefully while you are receiving this medication. This medication may increase the side effects of 5-fluorouracil. Tell your care team if you have diarrhea or mouth sores that do not get better or that get worse. What side effects may I notice from receiving this medication? Side effects that you should report to your care team as soon as possible: Allergic reactions--skin rash, itching, hives, swelling of the face, lips, tongue, or throat This list may not describe all possible side effects. Call your doctor for medical advice about side effects. You may report side effects to FDA at 1-800-FDA-1088. Where should I keep my medication? This medication is given in a hospital or clinic. It will not be stored at home. NOTE: This sheet is a summary. It may not cover all possible information. If you have questions about this medicine, talk to your doctor, pharmacist, or health care provider.  2024 Elsevier/Gold Standard (2021-08-03 00:00:00)    Fluorouracil Injection What is this medication? FLUOROURACIL (flure oh YOOR a sil) treats some types of cancer. It works by slowing down the growth of cancer cells. This medicine may be used for  other purposes; ask your health care provider or pharmacist if you have questions. COMMON BRAND NAME(S): Adrucil What should I tell my care team before I take this medication? They need to know if you have any of these conditions: Blood disorders Dihydropyrimidine dehydrogenase (DPD) deficiency Infection, such as chickenpox, cold sores, herpes Kidney disease Liver disease Poor nutrition Recent or ongoing radiation therapy An unusual or allergic reaction to fluorouracil, other medications, foods, dyes, or preservatives If you or your partner are pregnant or trying to get pregnant Breast-feeding How should I use this medication? This medication is injected into a vein. It is administered by your care team in a hospital or clinic setting. Talk to your care team about the use of this medication in children. Special care may be needed. Overdosage: If you think you have taken too much of this medicine contact a poison control center or emergency room at once. NOTE: This medicine is only  for you. Do not share this medicine with others. What if I miss a dose? Keep appointments for follow-up doses. It is important not to miss your dose. Call your care team if you are unable to keep an appointment. What may interact with this medication? Do not take this medication with any of the following: Live virus vaccines This medication may also interact with the following: Medications that treat or prevent blood clots, such as warfarin, enoxaparin, dalteparin This list may not describe all possible interactions. Give your health care provider a list of all the medicines, herbs, non-prescription drugs, or dietary supplements you use. Also tell them if you smoke, drink alcohol, or use illegal drugs. Some items may interact with your medicine. What should I watch for while using this medication? Your condition will be monitored carefully while you are receiving this medication. This medication may make you  feel generally unwell. This is not uncommon as chemotherapy can affect healthy cells as well as cancer cells. Report any side effects. Continue your course of treatment even though you feel ill unless your care team tells you to stop. In some cases, you may be given additional medications to help with side effects. Follow all directions for their use. This medication may increase your risk of getting an infection. Call your care team for advice if you get a fever, chills, sore throat, or other symptoms of a cold or flu. Do not treat yourself. Try to avoid being around people who are sick. This medication may increase your risk to bruise or bleed. Call your care team if you notice any unusual bleeding. Be careful brushing or flossing your teeth or using a toothpick because you may get an infection or bleed more easily. If you have any dental work done, tell your dentist you are receiving this medication. Avoid taking medications that contain aspirin, acetaminophen, ibuprofen, naproxen, or ketoprofen unless instructed by your care team. These medications may hide a fever. Do not treat diarrhea with over the counter products. Contact your care team if you have diarrhea that lasts more than 2 days or if it is severe and watery. This medication can make you more sensitive to the sun. Keep out of the sun. If you cannot avoid being in the sun, wear protective clothing and sunscreen. Do not use sun lamps, tanning beds, or tanning booths. Talk to your care team if you or your partner wish to become pregnant or think you might be pregnant. This medication can cause serious birth defects if taken during pregnancy and for 3 months after the last dose. A reliable form of contraception is recommended while taking this medication and for 3 months after the last dose. Talk to your care team about effective forms of contraception. Do not father a child while taking this medication and for 3 months after the last dose. Use a  condom while having sex during this time period. Do not breastfeed while taking this medication. This medication may cause infertility. Talk to your care team if you are concerned about your fertility. What side effects may I notice from receiving this medication? Side effects that you should report to your care team as soon as possible: Allergic reactions--skin rash, itching, hives, swelling of the face, lips, tongue, or throat Heart attack--pain or tightness in the chest, shoulders, arms, or jaw, nausea, shortness of breath, cold or clammy skin, feeling faint or lightheaded Heart failure--shortness of breath, swelling of the ankles, feet, or hands, sudden weight gain, unusual weakness  or fatigue Heart rhythm changes--fast or irregular heartbeat, dizziness, feeling faint or lightheaded, chest pain, trouble breathing High ammonia level--unusual weakness or fatigue, confusion, loss of appetite, nausea, vomiting, seizures Infection--fever, chills, cough, sore throat, wounds that don't heal, pain or trouble when passing urine, general feeling of discomfort or being unwell Low red blood cell level--unusual weakness or fatigue, dizziness, headache, trouble breathing Pain, tingling, or numbness in the hands or feet, muscle weakness, change in vision, confusion or trouble speaking, loss of balance or coordination, trouble walking, seizures Redness, swelling, and blistering of the skin over hands and feet Severe or prolonged diarrhea Unusual bruising or bleeding Side effects that usually do not require medical attention (report to your care team if they continue or are bothersome): Dry skin Headache Increased tears Nausea Pain, redness, or swelling with sores inside the mouth or throat Sensitivity to light Vomiting This list may not describe all possible side effects. Call your doctor for medical advice about side effects. You may report side effects to FDA at 1-800-FDA-1088. Where should I keep my  medication? This medication is given in a hospital or clinic. It will not be stored at home. NOTE: This sheet is a summary. It may not cover all possible information. If you have questions about this medicine, talk to your doctor, pharmacist, or health care provider.  2024 Elsevier/Gold Standard (2021-07-06 00:00:00)        To help prevent nausea and vomiting after your treatment, we encourage you to take your nausea medication as directed.  BELOW ARE SYMPTOMS THAT SHOULD BE REPORTED IMMEDIATELY: *FEVER GREATER THAN 100.4 F (38 C) OR HIGHER *CHILLS OR SWEATING *NAUSEA AND VOMITING THAT IS NOT CONTROLLED WITH YOUR NAUSEA MEDICATION *UNUSUAL SHORTNESS OF BREATH *UNUSUAL BRUISING OR BLEEDING *URINARY PROBLEMS (pain or burning when urinating, or frequent urination) *BOWEL PROBLEMS (unusual diarrhea, constipation, pain near the anus) TENDERNESS IN MOUTH AND THROAT WITH OR WITHOUT PRESENCE OF ULCERS (sore throat, sores in mouth, or a toothache) UNUSUAL RASH, SWELLING OR PAIN  UNUSUAL VAGINAL DISCHARGE OR ITCHING   Items with * indicate a potential emergency and should be followed up as soon as possible or go to the Emergency Department if any problems should occur.  Please show the CHEMOTHERAPY ALERT CARD or IMMUNOTHERAPY ALERT CARD at check-in to the Emergency Department and triage nurse.  Should you have questions after your visit or need to cancel or reschedule your appointment, please contact [facility] [phone]  and follow the prompts.  Office hours are 8:00 a.m. to 4:30 p.m. Monday - Friday. Please note that voicemails left after 4:00 p.m. may not be returned until the following business day.  We are closed weekends and major holidays. You have access to a nurse at all times for urgent questions. Please call the main number to the clinic [phone] and follow the prompts.  For any non-urgent questions, you may also contact your provider using MyChart. We now  offer e-Visits for anyone 24 and older to request care online for non-urgent symptoms. For details visit [email or web address].   Also download the MyChart app! Go to the app store, search "MyChart", open the app, select [place], and log in with your MyChart username and password.

## 2022-10-12 ENCOUNTER — Inpatient Hospital Stay: Payer: Medicaid Other

## 2022-10-12 VITALS — BP 111/73 | HR 69 | Temp 97.9°F | Resp 18

## 2022-10-12 DIAGNOSIS — Z5111 Encounter for antineoplastic chemotherapy: Secondary | ICD-10-CM | POA: Diagnosis not present

## 2022-10-12 DIAGNOSIS — C259 Malignant neoplasm of pancreas, unspecified: Secondary | ICD-10-CM

## 2022-10-12 MED ORDER — SODIUM CHLORIDE 0.9% FLUSH
10.0000 mL | INTRAVENOUS | Status: DC | PRN
  Administered 2022-10-12: 10 mL

## 2022-10-12 MED ORDER — HEPARIN SOD (PORK) LOCK FLUSH 100 UNIT/ML IV SOLN
500.0000 [IU] | Freq: Once | INTRAVENOUS | Status: AC | PRN
  Administered 2022-10-12: 500 [IU]

## 2022-10-12 NOTE — Patient Instructions (Signed)
[facility]  Discharge Instructions: Thank you for choosing [facility] to provide your oncology and hematology care.  If you have a lab appointment with the Cancer Center - please note that after April 8th, 2024, all labs will be drawn in the cancer center.  You do not have to check in or register with the main entrance as you have in the past but will complete your check-in in the cancer center.  Wear comfortable clothing and clothing appropriate for easy access to any Portacath or PICC line.   We strive to give you quality time with your provider. You may need to reschedule your appointment if you arrive late (15 or more minutes).  Arriving late affects you and other patients whose appointments are after yours.  Also, if you miss three or more appointments without notifying the office, you may be dismissed from the clinic at the provider's discretion.      For prescription refill requests, have your pharmacy contact our office and allow 72 hours for refills to be completed.    Today you received the following chemotherapy and/or immunotherapy agents 5FU pump stop      To help prevent nausea and vomiting after your treatment, we encourage you to take your nausea medication as directed.  BELOW ARE SYMPTOMS THAT SHOULD BE REPORTED IMMEDIATELY: *FEVER GREATER THAN 100.4 F (38 C) OR HIGHER *CHILLS OR SWEATING *NAUSEA AND VOMITING THAT IS NOT CONTROLLED WITH YOUR NAUSEA MEDICATION *UNUSUAL SHORTNESS OF BREATH *UNUSUAL BRUISING OR BLEEDING *URINARY PROBLEMS (pain or burning when urinating, or frequent urination) *BOWEL PROBLEMS (unusual diarrhea, constipation, pain near the anus) TENDERNESS IN MOUTH AND THROAT WITH OR WITHOUT PRESENCE OF ULCERS (sore throat, sores in mouth, or a toothache) UNUSUAL RASH, SWELLING OR PAIN  UNUSUAL VAGINAL DISCHARGE OR ITCHING   Items with * indicate a potential emergency and should be followed up as soon as possible or go to  the Emergency Department if any problems should occur.  Please show the CHEMOTHERAPY ALERT CARD or IMMUNOTHERAPY ALERT CARD at check-in to the Emergency Department and triage nurse.  Should you have questions after your visit or need to cancel or reschedule your appointment, please contact [facility] AT [facility] [phone]  and follow the prompts.  Office hours are 8:00 a.m. to 4:30 p.m. Monday - Friday. Please note that voicemails left after 4:00 p.m. may not be returned until the following business day.  We are closed weekends and major holidays. You have access to a nurse at all times for urgent questions. Please call the main number to the clinic [phone] and follow the prompts.  For any non-urgent questions, you may also contact your provider using MyChart. We now offer e-Visits for anyone 71 and older to request care online for non-urgent symptoms. For details visit [email or web address].   Also download the MyChart app! Go to the app store, search "MyChart", open the app, select [place], and log in with your MyChart username and password.

## 2022-10-12 NOTE — Progress Notes (Signed)
Patient presents today for 5FU pump stop and disconnection after 46 hour continous infusion.   5FU pump deaccessed.  Patients port flushed without difficulty.  Good blood return noted with no bruising or swelling noted at site.  Needle removed intact.  Band aid applied.  VSS with discharge and left in satisfactory condition via wheelchair with no s/s of distress noted.    

## 2022-10-14 ENCOUNTER — Other Ambulatory Visit: Payer: Self-pay

## 2022-10-14 MED ORDER — ONDANSETRON HCL 8 MG PO TABS: 8.0000 mg | ORAL_TABLET | Freq: Three times a day (TID) | ORAL | 0 refills | Status: AC | PRN

## 2022-10-14 NOTE — Progress Notes (Signed)
Patient called complaining that his nausea medications are not helping. He is taking Zofran 4 mg and Compazine 10 mg. Zofran increased to 8 mg per Doreatha Massed, MD standing orders.

## 2022-10-21 NOTE — Progress Notes (Signed)
Patient called reporting that he does not want any further chemotherapy. He states that is the "worst I have ever felt in my life," patient goes on to tell me that he threw up at least everyday following treatment and was nearly confined to his couch due to weakness. I ask that the patient come in as scheduled to talk with Dr. Ellin Saba and explore the options of decreased dosage or even other treatment options. Patient adamantly refuses, stating that he does not want to even come in to discuss options with Dr. Ellin Saba. We discuss Hospice/Palliative referrals. Patient and wife ask for Palliative Care Referral. Referral made to [facility] per Dr. Ellin Saba.

## 2022-10-24 ENCOUNTER — Inpatient Hospital Stay: Payer: Medicaid Other | Admitting: Dietician

## 2022-10-24 ENCOUNTER — Inpatient Hospital Stay: Payer: Medicaid Other

## 2022-10-24 ENCOUNTER — Inpatient Hospital Stay: Payer: Medicaid Other | Admitting: Hematology

## 2022-10-26 ENCOUNTER — Inpatient Hospital Stay: Payer: Medicaid Other

## 2022-11-07 ENCOUNTER — Inpatient Hospital Stay: Payer: Medicaid Other

## 2022-11-07 ENCOUNTER — Inpatient Hospital Stay: Payer: Medicaid Other | Admitting: Hematology

## 2022-11-09 ENCOUNTER — Inpatient Hospital Stay: Payer: Medicaid Other

## 2022-11-21 ENCOUNTER — Ambulatory Visit: Payer: PRIVATE HEALTH INSURANCE | Admitting: Hematology

## 2022-11-21 ENCOUNTER — Ambulatory Visit: Payer: PRIVATE HEALTH INSURANCE

## 2022-11-21 ENCOUNTER — Other Ambulatory Visit: Payer: PRIVATE HEALTH INSURANCE

## 2022-12-08 ENCOUNTER — Encounter: Payer: Self-pay | Admitting: Hematology

## 2022-12-16 ENCOUNTER — Other Ambulatory Visit (HOSPITAL_COMMUNITY): Payer: Self-pay | Admitting: Internal Medicine

## 2022-12-16 ENCOUNTER — Ambulatory Visit (HOSPITAL_COMMUNITY)
Admission: RE | Admit: 2022-12-16 | Discharge: 2022-12-16 | Disposition: A | Discharge status: Home / Self Care | Payer: No Typology Code available for payment source | Source: Ambulatory Visit | Attending: Internal Medicine | Admitting: Internal Medicine

## 2022-12-16 DIAGNOSIS — R1084 Generalized abdominal pain: Secondary | ICD-10-CM | POA: Diagnosis present

## 2023-04-15 DEATH — deceased

## 2023-11-17 ENCOUNTER — Other Ambulatory Visit: Payer: Self-pay | Admitting: *Deleted
